# Patient Record
Sex: Female | Born: 1970
Health system: Southern US, Community
[De-identification: ages and names within clinical notes are randomized; demographics above are authoritative.]

## PROBLEM LIST (undated history)

## (undated) DIAGNOSIS — R002 Palpitations: Secondary | ICD-10-CM

## (undated) DIAGNOSIS — J45909 Unspecified asthma, uncomplicated: Secondary | ICD-10-CM

## (undated) DIAGNOSIS — I951 Orthostatic hypotension: Secondary | ICD-10-CM

## (undated) DIAGNOSIS — K589 Irritable bowel syndrome without diarrhea: Secondary | ICD-10-CM

## (undated) DIAGNOSIS — G43909 Migraine, unspecified, not intractable, without status migrainosus: Secondary | ICD-10-CM

## (undated) HISTORY — DX: Palpitations: R00.2

## (undated) HISTORY — DX: Migraine, unspecified, not intractable, without status migrainosus: G43.909

## (undated) HISTORY — DX: Orthostatic hypotension: I95.1

---

## 1999-02-14 ENCOUNTER — Ambulatory Visit (HOSPITAL_COMMUNITY): Admission: RE | Admit: 1999-02-14 | Discharge: 1999-02-14 | Payer: Self-pay | Admitting: Internal Medicine

## 2000-06-06 ENCOUNTER — Emergency Department (HOSPITAL_COMMUNITY): Admission: EM | Admit: 2000-06-06 | Discharge: 2000-06-06 | Payer: Self-pay

## 2002-02-13 ENCOUNTER — Emergency Department (HOSPITAL_COMMUNITY): Admission: EM | Admit: 2002-02-13 | Discharge: 2002-02-13 | Payer: Self-pay | Admitting: *Deleted

## 2004-08-21 ENCOUNTER — Other Ambulatory Visit: Admission: RE | Admit: 2004-08-21 | Discharge: 2004-08-21 | Payer: Self-pay | Admitting: Family Medicine

## 2005-08-19 ENCOUNTER — Other Ambulatory Visit: Admission: RE | Admit: 2005-08-19 | Discharge: 2005-08-19 | Payer: Self-pay | Admitting: Family Medicine

## 2006-10-07 ENCOUNTER — Ambulatory Visit (HOSPITAL_COMMUNITY): Admission: RE | Admit: 2006-10-07 | Discharge: 2006-10-07 | Payer: Self-pay | Admitting: Obstetrics and Gynecology

## 2006-11-09 ENCOUNTER — Inpatient Hospital Stay (HOSPITAL_COMMUNITY): Admission: AD | Admit: 2006-11-09 | Discharge: 2006-11-12 | Payer: Self-pay | Admitting: Obstetrics and Gynecology

## 2006-11-10 ENCOUNTER — Encounter (INDEPENDENT_AMBULATORY_CARE_PROVIDER_SITE_OTHER): Payer: Self-pay | Admitting: Obstetrics and Gynecology

## 2006-11-23 ENCOUNTER — Ambulatory Visit: Admission: RE | Admit: 2006-11-23 | Discharge: 2006-11-23 | Payer: Self-pay | Admitting: Obstetrics and Gynecology

## 2006-11-23 ENCOUNTER — Encounter: Admission: RE | Admit: 2006-11-23 | Discharge: 2006-12-21 | Payer: Self-pay | Admitting: Obstetrics and Gynecology

## 2006-12-01 ENCOUNTER — Ambulatory Visit: Admission: RE | Admit: 2006-12-01 | Discharge: 2006-12-01 | Payer: Self-pay | Admitting: Obstetrics and Gynecology

## 2007-04-07 ENCOUNTER — Other Ambulatory Visit: Admission: RE | Admit: 2007-04-07 | Discharge: 2007-04-07 | Payer: Self-pay | Admitting: Family Medicine

## 2008-04-06 ENCOUNTER — Other Ambulatory Visit: Admission: RE | Admit: 2008-04-06 | Discharge: 2008-04-06 | Payer: Self-pay | Admitting: Family Medicine

## 2009-06-04 ENCOUNTER — Other Ambulatory Visit: Admission: RE | Admit: 2009-06-04 | Discharge: 2009-06-04 | Payer: Self-pay | Admitting: Family Medicine

## 2010-07-09 ENCOUNTER — Other Ambulatory Visit: Payer: Self-pay | Admitting: Family Medicine

## 2010-07-09 ENCOUNTER — Other Ambulatory Visit (HOSPITAL_COMMUNITY)
Admission: RE | Admit: 2010-07-09 | Discharge: 2010-07-09 | Disposition: A | Discharge status: Home / Self Care | Payer: Managed Care, Other (non HMO) | Source: Ambulatory Visit | Attending: Family Medicine | Admitting: Family Medicine

## 2010-07-09 DIAGNOSIS — Z124 Encounter for screening for malignant neoplasm of cervix: Secondary | ICD-10-CM | POA: Insufficient documentation

## 2011-02-18 LAB — URINALYSIS, DIPSTICK ONLY
Bilirubin Urine: NEGATIVE
Glucose, UA: NEGATIVE
Ketones, ur: NEGATIVE
Leukocytes, UA: NEGATIVE
Nitrite: NEGATIVE
Protein, ur: NEGATIVE
Specific Gravity, Urine: <1.005 — ABNORMAL LOW
Urobilinogen, UA: 0.2
pH: 6.5

## 2011-02-18 LAB — LACTATE DEHYDROGENASE: LDH: 143

## 2011-02-18 LAB — CBC
HCT: 32.4 — ABNORMAL LOW
HCT: 38.3
Hemoglobin: 10.9 — ABNORMAL LOW
Hemoglobin: 12.8
MCHC: 33.4
MCHC: 33.6
MCV: 93.9
MCV: 95
Platelets: 179
Platelets: 202
RBC: 3.41 — ABNORMAL LOW
RBC: 4.07
RDW: 14
RDW: 14.2 — ABNORMAL HIGH
WBC: 14.2 — ABNORMAL HIGH
WBC: 9.3

## 2011-02-18 LAB — COMPREHENSIVE METABOLIC PANEL
ALT: 21
AST: 22
Albumin: 2.9 — ABNORMAL LOW
Alkaline Phosphatase: 288 — ABNORMAL HIGH
BUN: 7
CO2: 27
Calcium: 9.2
Chloride: 105
Creatinine, Ser: 0.64
GFR calc Af Amer: >60
GFR calc non Af Amer: >60
Glucose, Bld: 85
Potassium: 3.8
Sodium: 139
Total Bilirubin: 0.4
Total Protein: 6.6

## 2011-02-18 LAB — RPR TITER
RPR Titer: 1:1 {titer}
RPR Titer: 1:1 {titer}

## 2011-02-18 LAB — URIC ACID: Uric Acid, Serum: 5.5

## 2011-02-18 LAB — RPR
RPR Ser Ql: REACTIVE — AB
RPR Ser Ql: REACTIVE — AB

## 2011-02-28 LAB — TPPA
Treponema Confirm: NONREACTIVE
Treponema Confirm: NONREACTIVE

## 2012-02-27 ENCOUNTER — Other Ambulatory Visit: Payer: Self-pay | Admitting: Family Medicine

## 2012-02-27 DIAGNOSIS — Z1231 Encounter for screening mammogram for malignant neoplasm of breast: Secondary | ICD-10-CM

## 2012-03-19 ENCOUNTER — Ambulatory Visit: Payer: Managed Care, Other (non HMO)

## 2012-09-30 ENCOUNTER — Ambulatory Visit (INDEPENDENT_AMBULATORY_CARE_PROVIDER_SITE_OTHER): Payer: BC Managed Care – PPO | Admitting: Cardiovascular Disease

## 2012-09-30 ENCOUNTER — Encounter: Payer: Self-pay | Admitting: *Deleted

## 2012-09-30 VITALS — BP 140/87 | HR 71 | Ht 68.0 in | Wt 148.0 lb

## 2012-09-30 DIAGNOSIS — I951 Orthostatic hypotension: Secondary | ICD-10-CM

## 2012-09-30 NOTE — Patient Instructions (Addendum)
Your physician recommends that you schedule a follow-up appointment in: as needed basis   Your physician recommends that you continue on your current medications as directed. Please refer to the Current Medication list given to you today.   

## 2012-09-30 NOTE — Progress Notes (Signed)
Marian Sorrow Date of Birth  1971/02/11       [facility] Office [address], [address]  [address], [address] [place], [place]  [address]   [place], [place]  [address] [phone]     [phone]   Fax  [phone]    Fax [phone]  Problem List: 1. Orthostatic hypotension 2. Palpitations 3. Migraine headaches  History of Present Illness:  Yannis is a 42 yo who is referred today for evaluation of orthostasis.  Orthostatic readings today Supine- 133/83   HR 74 Sitting 138/76  HR 74 Standing 158/94 HR 74  Pt saw Dr. Graciela Husbands in the 1990s.  He started her on florinef and she has felt much better.  She has had more dizziness.  She fainted 2 weeks ago - associated with GI bug and diarrhea.  Other than that she has not had any syncopal episodes sine starting the Florinef in 1990s.   She does not get any regular exercise.  She is active as a Pharmacist, hospital at [employer])    Current Outpatient Prescriptions  Medication Sig Dispense Refill  . ALPRAZolam (XANAX) 0.25 MG tablet Take 0.25 mg by mouth at bedtime as needed for sleep.      . Cholecalciferol (VITAMIN D PO) Take 2,000 Units by mouth.      . diphenhydrAMINE (SOMINEX) 25 MG tablet Take 25 mg by mouth at bedtime as needed for sleep.      . Ferrous Sulfate 27 MG TABS Take 27 mg by mouth.      . fludrocortisone (FLORINEF) 0.1 MG tablet Take 0.1 mg by mouth daily.      Marland Kitchen gabapentin (NEURONTIN) 400 MG capsule Take 400 mg by mouth 3 (three) times daily.      Marland Kitchen ibuprofen (ADVIL,MOTRIN) 200 MG tablet Take 200 mg by mouth every 6 (six) hours as needed for pain.      Marland Kitchen lidocaine (LIDODERM) 5 % Place 1 patch onto the skin daily. Remove & Discard patch within 12 hours or as directed by MD      . Multiple Vitamins-Minerals (CENTRUM PO) Take by mouth daily.      . norethindrone-ethinyl estradiol-iron (MICROGESTIN FE,GILDESS FE,LOESTRIN FE) 1.5-30 MG-MCG tablet Take 1 tablet by  mouth daily.      . Nutritional Supplements (BCAD 1) POWD Take by mouth.      . Potassium (POTASSIMIN PO) Take 10 mEq by mouth.      . SUMAtriptan (IMITREX) 100 MG tablet 100 mg.      . traMADol (ULTRAM) 50 MG tablet Take 50 mg by mouth every 6 (six) hours as needed for pain.       No current facility-administered medications for this visit.     Allergies  Allergen Reactions  . Septra (Sulfamethoxazole-Tmp Ds)     Past Medical History  Diagnosis Date  . Palpitations   . Orthostatic hypotension   . Migraines     No past surgical history on file.  History  Smoking status  . Never Smoker   Smokeless tobacco  . Not on file    History  Alcohol Use: Not on file    No family history on file.  Reviw of Systems:  Reviewed in the HPI.  All other systems are negative.  Physical Exam: Blood pressure 140/87, pulse 71, height 5\' 8"  (1.727 m), weight 148 lb (67.132 kg). General: Well developed, well nourished, in no acute distress.  Head: Normocephalic, atraumatic, sclera non-icteric, mucus membranes are moist,   Neck: Supple. Carotids are 2 + without bruits. No JVD   Lungs: Clear   Heart: RR, normal S1, S2, no significant murmurs  Abdomen: Soft, non-tender, non-distended with normal bowel sounds.  + palpable abdominal aorta  Msk:  Strength and tone are normal   Extremities: No clubbing or cyanosis. No edema.  Distal pedal pulses are 2+ and equal    Neuro: CN II - XII intact.  Alert and oriented X 3.   Psych:  Normal   ECG: 09/07/12 ( Dr. Jone Baseman office)  NSR at 69. No ST or T wave changes   Assessment / Plan:

## 2012-09-30 NOTE — Assessment & Plan Note (Signed)
Diana Boyd presents today for followup of her orthostatic hypotension. She actually has done for well for the past 10 or 20 years. She's not had any episodes of syncope. She did have one episode of fainting when she had a GI bug and subsequent diarrhea. She still has some episodes of dizziness but this is in the setting of normal blood pressure and normal heart rate. At this point I don't think that there is anything further that I have to offer. His cardiac exam. I don't think that an echocardiogram would be helpful think she has relatively normal function. Her EKG is completely normal. There is no evidence of heart block.  She's on several medications that can cause her blood pressure to vary. She's on Xanax which can cause her blood pressure to decrease. She's on Imitrex which will cause her blood pressure to go up. She also is on Florinef which is keeping her volume status relatively high.   She brought her blood pressure readings with her today and Bunnie Domino are completely normal.  At this point don't think we should change in her medications. I've reassured her that her condition seems to be fairly stable. We'll see her back on an as-needed basis. If she has any further episodes of fainting have encouraged her to see Dr. Graciela Husbands for her dysautonomia.

## 2012-12-02 ENCOUNTER — Ambulatory Visit
Admission: RE | Admit: 2012-12-02 | Discharge: 2012-12-02 | Disposition: A | Discharge status: Home / Self Care | Payer: BC Managed Care – PPO | Source: Ambulatory Visit | Attending: Family Medicine | Admitting: Family Medicine

## 2012-12-02 DIAGNOSIS — Z1231 Encounter for screening mammogram for malignant neoplasm of breast: Secondary | ICD-10-CM

## 2012-12-03 ENCOUNTER — Other Ambulatory Visit: Payer: Self-pay | Admitting: Family Medicine

## 2012-12-03 DIAGNOSIS — R928 Other abnormal and inconclusive findings on diagnostic imaging of breast: Secondary | ICD-10-CM

## 2012-12-17 ENCOUNTER — Ambulatory Visit
Admission: RE | Admit: 2012-12-17 | Discharge: 2012-12-17 | Disposition: A | Discharge status: Home / Self Care | Payer: BC Managed Care – PPO | Source: Ambulatory Visit | Attending: Family Medicine | Admitting: Family Medicine

## 2012-12-17 DIAGNOSIS — R928 Other abnormal and inconclusive findings on diagnostic imaging of breast: Secondary | ICD-10-CM

## 2013-01-04 ENCOUNTER — Other Ambulatory Visit: Payer: Self-pay

## 2013-01-04 DIAGNOSIS — Z1231 Encounter for screening mammogram for malignant neoplasm of breast: Secondary | ICD-10-CM

## 2013-10-21 ENCOUNTER — Other Ambulatory Visit (HOSPITAL_COMMUNITY)
Admission: RE | Admit: 2013-10-21 | Discharge: 2013-10-21 | Disposition: A | Discharge status: Home / Self Care | Payer: BC Managed Care – PPO | Source: Ambulatory Visit | Attending: Family Medicine | Admitting: Family Medicine

## 2013-10-21 ENCOUNTER — Other Ambulatory Visit: Payer: Self-pay | Admitting: Family Medicine

## 2013-10-21 DIAGNOSIS — Z124 Encounter for screening for malignant neoplasm of cervix: Secondary | ICD-10-CM | POA: Insufficient documentation

## 2013-10-25 LAB — CYTOLOGY - PAP

## 2014-01-31 ENCOUNTER — Other Ambulatory Visit: Payer: Self-pay

## 2014-01-31 DIAGNOSIS — Z1231 Encounter for screening mammogram for malignant neoplasm of breast: Secondary | ICD-10-CM

## 2014-02-15 ENCOUNTER — Encounter (INDEPENDENT_AMBULATORY_CARE_PROVIDER_SITE_OTHER): Payer: Self-pay

## 2014-02-15 ENCOUNTER — Ambulatory Visit
Admission: RE | Admit: 2014-02-15 | Discharge: 2014-02-15 | Disposition: A | Discharge status: Home / Self Care | Payer: BC Managed Care – PPO | Source: Ambulatory Visit

## 2014-02-15 DIAGNOSIS — Z1231 Encounter for screening mammogram for malignant neoplasm of breast: Secondary | ICD-10-CM

## 2015-01-11 ENCOUNTER — Encounter: Payer: Self-pay | Admitting: Neurology

## 2015-01-11 ENCOUNTER — Ambulatory Visit (INDEPENDENT_AMBULATORY_CARE_PROVIDER_SITE_OTHER): Payer: BLUE CROSS/BLUE SHIELD | Admitting: Neurology

## 2015-01-11 VITALS — BP 145/88 | HR 73 | Resp 18 | Ht 69.0 in | Wt 156.0 lb

## 2015-01-11 DIAGNOSIS — G4440 Drug-induced headache, not elsewhere classified, not intractable: Secondary | ICD-10-CM

## 2015-01-11 DIAGNOSIS — I951 Orthostatic hypotension: Secondary | ICD-10-CM | POA: Diagnosis not present

## 2015-01-11 DIAGNOSIS — G43019 Migraine without aura, intractable, without status migrainosus: Secondary | ICD-10-CM | POA: Diagnosis not present

## 2015-01-11 DIAGNOSIS — T3995XA Adverse effect of unspecified nonopioid analgesic, antipyretic and antirheumatic, initial encounter: Secondary | ICD-10-CM

## 2015-01-11 MED ORDER — TOPIRAMATE 25 MG PO CPSP: 25.0000 mg | ORAL_CAPSULE | Freq: Every day | ORAL | Status: DC

## 2015-01-11 MED ORDER — SUMATRIPTAN SUCCINATE 25 MG PO TABS: ORAL_TABLET | ORAL | Status: DC

## 2015-01-11 NOTE — Patient Instructions (Signed)
Topiramate tablets What is this medicine? TOPIRAMATE (toe PYRE a mate) is used to treat seizures in adults or children with epilepsy. It is also used for the prevention of migraine headaches. This medicine may be used for other purposes; ask your health care provider or pharmacist if you have questions. COMMON BRAND NAME(S): Topamax, Topiragen What should I tell my health care provider before I take this medicine? They need to know if you have any of these conditions: -bleeding disorders -cirrhosis of the liver or liver disease -diarrhea -glaucoma -kidney stones or kidney disease -low blood counts, like low white cell, platelet, or red cell counts -lung disease like asthma, obstructive pulmonary disease, emphysema -metabolic acidosis -on a ketogenic diet -schedule for surgery or a procedure -suicidal thoughts, plans, or attempt; a previous suicide attempt by you or a family member -an unusual or allergic reaction to topiramate, other medicines, foods, dyes, or preservatives -pregnant or trying to get pregnant -breast-feeding How should I use this medicine? Take this medicine by mouth with a glass of water. Follow the directions on the prescription label. Do not crush or chew. You may take this medicine with meals. Take your medicine at regular intervals. Do not take it more often than directed. Talk to your pediatrician regarding the use of this medicine in children. Special care may be needed. While this drug may be prescribed for children as young as 2 years of age for selected conditions, precautions do apply. Overdosage: If you think you have taken too much of this medicine contact a poison control center or emergency room at once. NOTE: This medicine is only for you. Do not share this medicine with others. What if I miss a dose? If you miss a dose, take it as soon as you can. If your next dose is to be taken in less than 6 hours, then do not take the missed dose. Take the next dose at  your regular time. Do not take double or extra doses. What may interact with this medicine? Do not take this medicine with any of the following medications: -probenecid This medicine may also interact with the following medications: -acetazolamide -alcohol -amitriptyline -aspirin and aspirin-like medicines -birth control pills -certain medicines for depression -certain medicines for seizures -certain medicines that treat or prevent blood clots like warfarin, enoxaparin, dalteparin, apixaban, dabigatran, and rivaroxaban -digoxin -hydrochlorothiazide -lithium -medicines for pain, sleep, or muscle relaxation -metformin -methazolamide -NSAIDS, medicines for pain and inflammation, like ibuprofen or naproxen -pioglitazone -risperidone This list may not describe all possible interactions. Give your health care provider a list of all the medicines, herbs, non-prescription drugs, or dietary supplements you use. Also tell them if you smoke, drink alcohol, or use illegal drugs. Some items may interact with your medicine. What should I watch for while using this medicine? Visit your doctor or health care professional for regular checks on your progress. Do not stop taking this medicine suddenly. This increases the risk of seizures if you are using this medicine to control epilepsy. Wear a medical identification bracelet or chain to say you have epilepsy or seizures, and carry a card that lists all your medicines. This medicine can decrease sweating and increase your body temperature. Watch for signs of deceased sweating or fever, especially in children. Avoid extreme heat, hot baths, and saunas. Be careful about exercising, especially in hot weather. Contact your health care provider right away if you notice a fever or decrease in sweating. You should drink plenty of fluids while taking this medicine.   If you have had kidney stones in the past, this will help to reduce your chances of forming kidney  stones. If you have stomach pain, with nausea or vomiting and yellowing of your eyes or skin, call your doctor immediately. You may get drowsy, dizzy, or have blurred vision. Do not drive, use machinery, or do anything that needs mental alertness until you know how this medicine affects you. To reduce dizziness, do not sit or stand up quickly, especially if you are an older patient. Alcohol can increase drowsiness and dizziness. Avoid alcoholic drinks. If you notice blurred vision, eye pain, or other eye problems, seek medical attention at once for an eye exam. The use of this medicine may increase the chance of suicidal thoughts or actions. Pay special attention to how you are responding while on this medicine. Any worsening of mood, or thoughts of suicide or dying should be reported to your health care professional right away. This medicine may increase the chance of developing metabolic acidosis. If left untreated, this can cause kidney stones, bone disease, or slowed growth in children. Symptoms include breathing fast, fatigue, loss of appetite, irregular heartbeat, or loss of consciousness. Call your doctor immediately if you experience any of these side effects. Also, tell your doctor about any surgery you plan on having while taking this medicine since this may increase your risk for metabolic acidosis. Birth control pills may not work properly while you are taking this medicine. Talk to your doctor about using an extra method of birth control. Women who become pregnant while using this medicine may enroll in the [place] American Antiepileptic Drug Pregnancy Registry by calling 1-888-233-2334. This registry collects information about the safety of antiepileptic drug use during pregnancy. What side effects may I notice from receiving this medicine? Side effects that you should report to your doctor or health care professional as soon as possible: -allergic reactions like skin rash, itching or hives,  swelling of the face, lips, or tongue -decreased sweating and/or rise in body temperature -depression -difficulty breathing, fast or irregular breathing patterns -difficulty speaking -difficulty walking or controlling muscle movements -hearing impairment -redness, blistering, peeling or loosening of the skin, including inside the mouth -tingling, pain or numbness in the hands or feet -unusual bleeding or bruising -unusually weak or tired -worsening of mood, thoughts or actions of suicide or dying Side effects that usually do not require medical attention (report to your doctor or health care professional if they continue or are bothersome): -altered taste -back pain, joint or muscle aches and pains -diarrhea, or constipation -headache -loss of appetite -nausea -stomach upset, indigestion -tremors This list may not describe all possible side effects. Call your doctor for medical advice about side effects. You may report side effects to FDA at 1-800-FDA-1088. Where should I keep my medicine? Keep out of the reach of children. Store at room temperature between 15 and 30 degrees C (59 and 86 degrees F) in a tightly closed container. Protect from moisture. Throw away any unused medicine after the expiration date. NOTE: This sheet is a summary. It may not cover all possible information. If you have questions about this medicine, talk to your doctor, pharmacist, or health care provider.  2015, Elsevier/Gold Standard. (2013-04-25 23:17:57)  
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
## 2015-02-02 ENCOUNTER — Telehealth: Payer: Self-pay | Admitting: Neurology

## 2015-02-02 MED ORDER — BUTALBITAL-APAP-CAFFEINE 50-325-40 MG PO TABS: 1.0000 | ORAL_TABLET | Freq: Four times a day (QID) | ORAL | Status: DC | PRN

## 2015-02-02 NOTE — Telephone Encounter (Signed)
The patient is exquisitely sensitive to sumatriptan 25 mg which has been the lowest dose I could find for triptan anywhere. I will ask her to break the tablet in half but apparently the size of the tablet does not allow a clear separation. I would instead ask her to try a low dose of Frova. I also will be happy to give her some Fioricet for that she can sustain a workday. CD

## 2015-02-02 NOTE — Telephone Encounter (Signed)
Patient is calling and states that the Rx Sumatriptan 25 mg she is taking is making her pass out and throw up.  Is there anything else she can take during the day for her migraines. Please call.

## 2015-02-22 ENCOUNTER — Ambulatory Visit: Payer: BLUE CROSS/BLUE SHIELD | Admitting: Adult Health

## 2015-03-22 ENCOUNTER — Ambulatory Visit (INDEPENDENT_AMBULATORY_CARE_PROVIDER_SITE_OTHER): Payer: BLUE CROSS/BLUE SHIELD | Admitting: Adult Health

## 2015-03-22 ENCOUNTER — Encounter: Payer: Self-pay | Admitting: Adult Health

## 2015-03-22 VITALS — BP 150/82 | HR 71 | Ht 69.0 in | Wt 154.0 lb

## 2015-03-22 DIAGNOSIS — G43009 Migraine without aura, not intractable, without status migrainosus: Secondary | ICD-10-CM

## 2015-03-22 MED ORDER — TOPIRAMATE 25 MG PO TABS: 50.0000 mg | ORAL_TABLET | Freq: Every day | ORAL | Status: DC

## 2015-03-22 NOTE — Patient Instructions (Signed)
Try increasing Topamax to 2 tablets at bedtime (total 50 mg) Use back up method for birth control while on Topamax If your symptoms worsen or you develop new symptoms please let us know.
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
## 2015-03-22 NOTE — Progress Notes (Signed)
I agree with the assessment and plan as directed by NP .The patient is known to me .   Caleel Kiner, MD  

## 2015-06-07 ENCOUNTER — Other Ambulatory Visit: Payer: Self-pay

## 2015-06-07 DIAGNOSIS — Z1231 Encounter for screening mammogram for malignant neoplasm of breast: Secondary | ICD-10-CM

## 2015-06-21 ENCOUNTER — Ambulatory Visit: Payer: Self-pay

## 2015-06-28 ENCOUNTER — Ambulatory Visit: Payer: BLUE CROSS/BLUE SHIELD | Admitting: Adult Health

## 2015-06-28 DIAGNOSIS — Z0279 Encounter for issue of other medical certificate: Secondary | ICD-10-CM

## 2015-06-29 ENCOUNTER — Encounter: Payer: Self-pay | Admitting: Adult Health

## 2015-07-05 ENCOUNTER — Ambulatory Visit: Payer: Self-pay

## 2015-08-02 ENCOUNTER — Ambulatory Visit
Admission: RE | Admit: 2015-08-02 | Discharge: 2015-08-02 | Disposition: A | Discharge status: Home / Self Care | Payer: 59 | Source: Ambulatory Visit

## 2015-08-02 DIAGNOSIS — Z1231 Encounter for screening mammogram for malignant neoplasm of breast: Secondary | ICD-10-CM

## 2015-09-25 ENCOUNTER — Ambulatory Visit: Payer: BLUE CROSS/BLUE SHIELD | Admitting: Adult Health

## 2015-10-02 ENCOUNTER — Ambulatory Visit (INDEPENDENT_AMBULATORY_CARE_PROVIDER_SITE_OTHER): Payer: 59 | Admitting: Adult Health

## 2015-10-02 ENCOUNTER — Encounter: Payer: Self-pay | Admitting: Adult Health

## 2015-10-02 VITALS — BP 138/82 | HR 72 | Resp 18 | Ht 69.0 in | Wt 157.0 lb

## 2015-10-02 DIAGNOSIS — G43009 Migraine without aura, not intractable, without status migrainosus: Secondary | ICD-10-CM

## 2015-10-02 MED ORDER — ZONISAMIDE 25 MG PO CAPS: 75.0000 mg | ORAL_CAPSULE | Freq: Every day | ORAL | Status: DC

## 2015-10-02 NOTE — Progress Notes (Signed)
I agree with the assessment and plan as directed by NP .The patient is known to me .   Kanoe Wanner, MD  
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
## 2015-10-02 NOTE — Patient Instructions (Signed)
Increase Zonegran 75 mg daily  If your symptoms worsen or you develop new symptoms please let us know.

## 2015-10-22 ENCOUNTER — Telehealth: Payer: Self-pay | Admitting: Adult Health

## 2015-10-22 NOTE — Telephone Encounter (Signed)
Byron came in and she said that when she came in here left she is taking Zonegran from 50 MG to 75 MG. And she is having problems with the increase in MG.. She is having memory problems. Short term and long term memory problems. Also having problems focusing. She is willing to try a new drug for her memory. The best number to contact the patient is [phone]

## 2015-10-23 MED ORDER — ZONISAMIDE 25 MG PO CAPS
50.0000 mg | ORAL_CAPSULE | Freq: Every day | ORAL | Status: DC
Start: 2015-10-23 — End: 2015-10-31

## 2015-10-23 NOTE — Telephone Encounter (Signed)
I called the patient. She states that when Zonegran was increased to 75 mg she started to notice problems with her memory. She states that it is affecting her at work. However it has been very beneficial for her headaches. She states that she has 1 headache a week at this dose. For now we will decrease Zonegran to 50 mg. If her symptoms do not improve we will have to discontinue this medication and try another one.

## 2015-10-23 NOTE — Telephone Encounter (Signed)
Pt has f/u here 01-10-16.   Last seen 10-02-15 when zonegran increased.

## 2015-10-25 ENCOUNTER — Other Ambulatory Visit: Payer: Self-pay | Admitting: Neurology

## 2015-10-30 NOTE — Telephone Encounter (Signed)
I spoke to pt.  She stated with the 50mg  zonegran still with problems, focus, memory.  Baclofen not working, makes groggy as well.  Since asking for medication changes, made appt for tomorrow at 1500.  Pt verbalized understanding. She also mentioned sleep issues, can get to sleep but awakens and cannot go back to sleep.

## 2015-10-30 NOTE — Telephone Encounter (Signed)
Patient called to advise, "Zonegran and Baclofen are not working, medicines need to be changed. Melatonin not helping, needs something for sleep, gets to sleep okay, doesn't stay asleep".

## 2015-10-31 ENCOUNTER — Encounter: Payer: Self-pay | Admitting: Adult Health

## 2015-10-31 ENCOUNTER — Ambulatory Visit (INDEPENDENT_AMBULATORY_CARE_PROVIDER_SITE_OTHER): Payer: 59 | Admitting: Adult Health

## 2015-10-31 VITALS — BP 156/86 | HR 68 | Ht 69.0 in | Wt 157.6 lb

## 2015-10-31 DIAGNOSIS — G43019 Migraine without aura, intractable, without status migrainosus: Secondary | ICD-10-CM

## 2015-10-31 MED ORDER — SUMATRIPTAN SUCCINATE 25 MG PO TABS: ORAL_TABLET | ORAL | Status: DC

## 2015-10-31 MED ORDER — NORTRIPTYLINE HCL 10 MG PO CAPS: 10.0000 mg | ORAL_CAPSULE | Freq: Every day | ORAL | Status: DC

## 2015-10-31 NOTE — Progress Notes (Signed)
I have read the note, and I agree with the clinical assessment and plan.  Romina Divirgilio KEITH   

## 2015-10-31 NOTE — Patient Instructions (Signed)
Take zonegran 25 mg for next two nights then discontinue Start Nortriptyline 10 mg at bedtime Stop baclofen Use Imitrex 1 tablet at onset of migraine can repeat in 2 hours if needed If your symptoms worsen or you develop new symptoms please let us know.  Nortriptyline capsules What is this medicine? NORTRIPTYLINE (nor TRIP ti leen) is used to treat depression. This medicine may be used for other purposes; ask your health care provider or pharmacist if you have questions. What should I tell my health care provider before I take this medicine? They need to know if you have any of these conditions: -an alcohol problem -bipolar disorder or schizophrenia -difficulty passing urine, prostate trouble -glaucoma -heart disease or recent heart attack -liver disease -over active thyroid -seizures -thoughts or plans of suicide or a previous suicide attempt or family history of suicide attempt -an unusual or allergic reaction to nortriptyline, other medicines, foods, dyes, or preservatives -pregnant or trying to get pregnant -breast-feeding How should I use this medicine? Take this medicine by mouth with a glass of water. Follow the directions on the prescription label. Take your doses at regular intervals. Do not take it more often than directed. Do not stop taking this medicine suddenly except upon the advice of your doctor. Stopping this medicine too quickly may cause serious side effects or your condition may worsen. A special MedGuide will be given to you by the pharmacist with each prescription and refill. Be sure to read this information carefully each time. Talk to your pediatrician regarding the use of this medicine in children. Special care may be needed. Overdosage: If you think you have taken too much of this medicine contact a poison control center or emergency room at once. NOTE: This medicine is only for you. Do not share this medicine with others. What if I miss a dose? If you miss a  dose, take it as soon as you can. If it is almost time for your next dose, take only that dose. Do not take double or extra doses. What may interact with this medicine? Do not take this medicine with any of the following medications: -arsenic trioxide -certain medicines medicines for irregular heart beat -cisapride -halofantrine -linezolid -MAOIs like Carbex, Eldepryl, Marplan, Nardil, and Parnate -methylene blue (injected into a vein) -other medicines for mental depression -phenothiazines like perphenazine, thioridazine and chlorpromazine -pimozide -probucol -procarbazine -sparfloxacin -St. John's Wort -ziprasidone This medicine may also interact with any of the following medications: -atropine and related drugs like hyoscyamine, scopolamine, tolterodine and others -barbiturate medicines for inducing sleep or treating seizures, such as phenobarbital -cimetidine -medicines for diabetes -medicines for seizures like carbamazepine or phenytoin -reserpine -thyroid medicine This list may not describe all possible interactions. Give your health care provider a list of all the medicines, herbs, non-prescription drugs, or dietary supplements you use. Also tell them if you smoke, drink alcohol, or use illegal drugs. Some items may interact with your medicine. What should I watch for while using this medicine? Tell your doctor if your symptoms do not get better or if they get worse. Visit your doctor or health care professional for regular checks on your progress. Because it may take several weeks to see the full effects of this medicine, it is important to continue your treatment as prescribed by your doctor. Patients and their families should watch out for new or worsening thoughts of suicide or depression. Also watch out for sudden changes in feelings such as feeling anxious, agitated, panicky, irritable, hostile, aggressive,  impulsive, severely restless, overly excited and hyperactive, or not  being able to sleep. If this happens, especially at the beginning of treatment or after a change in dose, call your health care professional. Bonita QuinYou may get drowsy or dizzy. Do not drive, use machinery, or do anything that needs mental alertness until you know how this medicine affects you. Do not stand or sit up quickly, especially if you are an older patient. This reduces the risk of dizzy or fainting spells. Alcohol may interfere with the effect of this medicine. Avoid alcoholic drinks. Do not treat yourself for coughs, colds, or allergies without asking your doctor or health care professional for advice. Some ingredients can increase possible side effects. Your mouth may get dry. Chewing sugarless gum or sucking hard candy, and drinking plenty of water may help. Contact your doctor if the problem does not go away or is severe. This medicine may cause dry eyes and blurred vision. If you wear contact lenses you may feel some discomfort. Lubricating drops may help. See your eye doctor if the problem does not go away or is severe. This medicine can cause constipation. Try to have a bowel movement at least every 2 to 3 days. If you do not have a bowel movement for 3 days, call your doctor or health care professional. This medicine can make you more sensitive to the sun. Keep out of the sun. If you cannot avoid being in the sun, wear protective clothing and use sunscreen. Do not use sun lamps or tanning beds/booths. What side effects may I notice from receiving this medicine? Side effects that you should report to your doctor or health care professional as soon as possible: -allergic reactions like skin rash, itching or hives, swelling of the face, lips, or tongue -abnormal production of milk in females -breast enlargement in both males and females -breathing problems -confusion, hallucinations -fever with increased sweating -irregular or fast, pounding heartbeat -muscle stiffness, or spasms -pain or  difficulty passing urine, loss of bladder control -seizures -suicidal thoughts or other mood changes -swelling of the testicles -tingling, pain, or numbness in the feet or hands -yellowing of the eyes or skin Side effects that usually do not require medical attention (report to your doctor or health care professional if they continue or are bothersome): -change in sex drive or performance -diarrhea -nausea, vomiting -weight gain or loss This list may not describe all possible side effects. Call your doctor for medical advice about side effects. You may report side effects to FDA at 1-800-FDA-1088. Where should I keep my medicine? Keep out of the reach of children. Store at room temperature between 15 and 30 degrees C (59 and 86 degrees F). Keep container tightly closed. Throw away any unused medicine after the expiration date. NOTE: This sheet is a summary. It may not cover all possible information. If you have questions about this medicine, talk to your doctor, pharmacist, or health care provider.    2016, Elsevier/Gold Standard. (2011-09-08 13:57:12)
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
## 2015-11-01 ENCOUNTER — Telehealth: Payer: Self-pay

## 2015-11-01 NOTE — Telephone Encounter (Signed)
[phone] (received fax confirmation).

## 2015-11-01 NOTE — Telephone Encounter (Signed)
Received fax from [facility].    Medication is on plan's list of covered drugs.  PA not required at this time.  [phone].   [id number]

## 2015-11-01 NOTE — Telephone Encounter (Signed)
Completed pa for sumatriptan, [facility]. Should have a determination in 3-5 business days.

## 2015-11-12 ENCOUNTER — Telehealth: Payer: Self-pay | Admitting: Adult Health

## 2015-11-12 NOTE — Telephone Encounter (Signed)
Pt called to advise she is still not sleeping. sts 10mg  melatonin is not working. She took a benadryl last night. Please call

## 2015-11-13 ENCOUNTER — Encounter: Payer: Self-pay | Admitting: Adult Health

## 2015-11-13 NOTE — Telephone Encounter (Signed)
I called the patient and left message.

## 2015-11-13 NOTE — Telephone Encounter (Signed)
Pt returned Megan's call. °

## 2015-11-14 MED ORDER — NORTRIPTYLINE HCL 10 MG PO CAPS: 20.0000 mg | ORAL_CAPSULE | Freq: Every day | ORAL | Status: DC

## 2015-11-14 NOTE — Telephone Encounter (Signed)
Responded to patients email.

## 2015-11-28 ENCOUNTER — Other Ambulatory Visit: Payer: Self-pay | Admitting: Neurology

## 2015-11-28 ENCOUNTER — Encounter: Payer: Self-pay | Admitting: Adult Health

## 2015-11-28 MED ORDER — NORTRIPTYLINE HCL 25 MG PO CAPS: 25.0000 mg | ORAL_CAPSULE | Freq: Every day | ORAL | 12 refills | Status: DC

## 2015-12-07 ENCOUNTER — Encounter: Payer: Self-pay | Admitting: Adult Health

## 2016-01-02 ENCOUNTER — Encounter: Payer: Self-pay | Admitting: Adult Health

## 2016-01-02 MED ORDER — PREDNISONE 5 MG PO TABS: ORAL_TABLET | ORAL | 0 refills | Status: DC

## 2016-01-02 NOTE — Telephone Encounter (Signed)
I called the patient. She states prior to Friday she was having 1-2 headaches a week. Which is a great improvement in her migraines. She states since Friday she's had a headache that she is been unable to resolve. She's had prednisone several years back. I will order the patient a prednisone Dosepak I have reviewed the side effects with the patient.

## 2016-01-10 ENCOUNTER — Ambulatory Visit: Payer: 59 | Admitting: Adult Health

## 2016-01-24 ENCOUNTER — Ambulatory Visit: Payer: 59 | Admitting: Adult Health

## 2016-02-11 ENCOUNTER — Other Ambulatory Visit: Payer: Self-pay | Admitting: Neurology

## 2016-02-28 ENCOUNTER — Ambulatory Visit (INDEPENDENT_AMBULATORY_CARE_PROVIDER_SITE_OTHER): Payer: 59 | Admitting: Adult Health

## 2016-02-28 ENCOUNTER — Encounter: Payer: Self-pay | Admitting: Adult Health

## 2016-02-28 VITALS — BP 142/90 | HR 80 | Resp 16 | Ht 69.0 in | Wt 157.5 lb

## 2016-02-28 DIAGNOSIS — G4700 Insomnia, unspecified: Secondary | ICD-10-CM | POA: Diagnosis not present

## 2016-02-28 DIAGNOSIS — G43009 Migraine without aura, not intractable, without status migrainosus: Secondary | ICD-10-CM

## 2016-02-28 MED ORDER — NORTRIPTYLINE HCL 10 MG PO CAPS: 30.0000 mg | ORAL_CAPSULE | Freq: Every day | ORAL | 11 refills | Status: DC

## 2016-02-28 MED ORDER — SUMATRIPTAN SUCCINATE 3 MG/0.5ML ~~LOC~~ SOAJ: 3.0000 mg | Freq: Every day | SUBCUTANEOUS | 5 refills | Status: DC | PRN

## 2016-02-28 NOTE — Patient Instructions (Addendum)
Increase nortriptyline to 30 mg at bedtime Try zembrace- sumatriptan injection for headache 3mg /0.5 ml- 1 injection as soon as the headache starts. If your symptoms worsen or you develop new symptoms please let us know.
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
## 2016-02-28 NOTE — Progress Notes (Signed)
I agree with the assessment and plan as directed by NP .The patient is known to me .   Brandi Armato, MD  

## 2016-03-19 ENCOUNTER — Ambulatory Visit: Payer: 59 | Admitting: Neurology

## 2016-04-13 ENCOUNTER — Encounter: Payer: Self-pay | Admitting: Adult Health

## 2016-04-14 MED ORDER — PREDNISONE 5 MG PO TABS: ORAL_TABLET | ORAL | 0 refills | Status: DC

## 2016-04-14 NOTE — Telephone Encounter (Signed)
Patient will be given a prednisone Dosepak for ongoing headache. Her last Dosepak was in August. If this does not resolve her headache and we will need to consider adjusting her medications.

## 2016-04-23 ENCOUNTER — Encounter: Payer: Self-pay | Admitting: Adult Health

## 2016-04-23 ENCOUNTER — Other Ambulatory Visit: Payer: Self-pay | Admitting: Adult Health

## 2016-04-24 ENCOUNTER — Telehealth: Payer: Self-pay | Admitting: Adult Health

## 2016-04-24 NOTE — Telephone Encounter (Signed)
I called the patient. Left a voice message. Perhaps we could increase nortriptyline to 40 mg if she feels that she can tolerate it.

## 2016-04-24 NOTE — Telephone Encounter (Signed)
Pt called back reg email. I told her it was rec'd but it has not been addressed yet by NP. Says she wanted to make sure it went to NP

## 2016-05-19 ENCOUNTER — Telehealth: Payer: Self-pay | Admitting: *Deleted

## 2016-05-19 NOTE — Telephone Encounter (Signed)
PA approval Zembrace. Approved thru 05-09-2017.  ID# [id number].  PA # [id number]. [facility] [phone]. ssy

## 2016-05-30 ENCOUNTER — Encounter: Payer: Self-pay | Admitting: Adult Health

## 2016-06-02 ENCOUNTER — Other Ambulatory Visit: Payer: Self-pay | Admitting: *Deleted

## 2016-06-02 MED ORDER — NORTRIPTYLINE HCL 10 MG PO CAPS: 40.0000 mg | ORAL_CAPSULE | Freq: Every day | ORAL | 9 refills | Status: DC

## 2016-07-03 ENCOUNTER — Encounter: Payer: Self-pay | Admitting: Adult Health

## 2016-09-12 DIAGNOSIS — I951 Orthostatic hypotension: Secondary | ICD-10-CM | POA: Diagnosis not present

## 2016-09-12 DIAGNOSIS — G43909 Migraine, unspecified, not intractable, without status migrainosus: Secondary | ICD-10-CM | POA: Diagnosis not present

## 2016-09-12 DIAGNOSIS — R030 Elevated blood-pressure reading, without diagnosis of hypertension: Secondary | ICD-10-CM | POA: Diagnosis not present

## 2016-09-12 DIAGNOSIS — D649 Anemia, unspecified: Secondary | ICD-10-CM | POA: Diagnosis not present

## 2016-09-16 ENCOUNTER — Encounter: Payer: Self-pay | Admitting: Adult Health

## 2016-09-18 ENCOUNTER — Other Ambulatory Visit: Payer: Self-pay | Admitting: Adult Health

## 2016-09-19 NOTE — Telephone Encounter (Signed)
This looks like you discontinued back in 02/2016, Last email she was taking.  On zembrace too.  Refill?

## 2016-09-24 ENCOUNTER — Other Ambulatory Visit: Payer: Self-pay | Admitting: Family Medicine

## 2016-09-24 DIAGNOSIS — Z1231 Encounter for screening mammogram for malignant neoplasm of breast: Secondary | ICD-10-CM

## 2016-09-25 ENCOUNTER — Encounter: Payer: Self-pay | Admitting: Adult Health

## 2016-10-15 ENCOUNTER — Ambulatory Visit: Payer: 59

## 2016-10-22 ENCOUNTER — Other Ambulatory Visit: Payer: Self-pay | Admitting: Adult Health

## 2016-10-30 ENCOUNTER — Encounter: Payer: Self-pay | Admitting: Adult Health

## 2016-10-30 MED ORDER — PREDNISONE 5 MG PO TABS: ORAL_TABLET | ORAL | 0 refills | Status: DC

## 2016-11-03 ENCOUNTER — Ambulatory Visit
Admission: RE | Admit: 2016-11-03 | Discharge: 2016-11-03 | Disposition: A | Discharge status: Home / Self Care | Payer: 59 | Source: Ambulatory Visit | Attending: Family Medicine | Admitting: Family Medicine

## 2016-11-03 DIAGNOSIS — Z1231 Encounter for screening mammogram for malignant neoplasm of breast: Secondary | ICD-10-CM

## 2016-11-20 ENCOUNTER — Encounter: Payer: Self-pay | Admitting: Adult Health

## 2016-11-20 ENCOUNTER — Ambulatory Visit (INDEPENDENT_AMBULATORY_CARE_PROVIDER_SITE_OTHER): Payer: 59 | Admitting: Adult Health

## 2016-11-20 VITALS — BP 142/90 | HR 72 | Ht 69.0 in | Wt 172.2 lb

## 2016-11-20 DIAGNOSIS — G43019 Migraine without aura, intractable, without status migrainosus: Secondary | ICD-10-CM | POA: Diagnosis not present

## 2016-11-20 MED ORDER — SUMATRIPTAN SUCCINATE 25 MG PO TABS: ORAL_TABLET | ORAL | 5 refills | Status: DC

## 2016-11-20 NOTE — Patient Instructions (Addendum)
Your Plan:  Continue Imitrex Aimovig ordered If your symptoms worsen or you develop new symptoms please let us know.   Thank you for coming to see us at [facility]. I hope we have been able to provide you high quality care today.  You may receive a patient satisfaction survey over the next few weeks. We would appreciate your feedback and comments so that we may continue to improve ourselves and the health of our patients.

## 2016-11-20 NOTE — Progress Notes (Signed)
PATIENT: Diana Boyd DOB: 27-Mar-1971  REASON FOR VISIT: follow up- migraine headaches HISTORY FROM: patient  HISTORY OF PRESENT ILLNESS: Diana Boyd is a 46 year old female with a history of migraine headaches. She returns today for follow-up. The patient states that she stopped nortriptyline about a month ago. Reports that it was not beneficial and was causing weight gain. She states that she essentially has a daily headache. It is normally across the forehead. She does have photophobia but denies phonophobia. She reports that she occasionally has nausea and vomiting as well. She has tried multiple medications in the past including gabapentin, Topamax, beta blockers, Zonegran, baclofen. She states Imitrex does offer her some benefit. She returns today for an evaluation.  HISTORY Diana Boyd is a 46 year old female with a history of migraine headaches. She returns today for follow-up. She is currently taking nortriptyline 25 mg at bedtime. She reports that this has been beneficial for her headaches. She has approximately 2 headaches a week. She feels that her headaches are related to her hormones. She states that she does use Imitrex with good benefit however it does have a very slow onset and sometimes it does not resolve her headaches completely. The patient has been on several other medications in the past with minimal benefit. It appears that nortriptyline has offered her the best benefit this far. She also struggles with insomnia. She has never had a sleep study. She does not feel that she snores. She is sometimes tired during the day. She returns today for an evaluation.  HISTORY 10/31/15: Diana Boyd is a 46 year old female with a history of migraine headaches. She returns today for follow-up. She states that the Zonegran 75 mg did help with her headaches but it was affecting her memory and attention. We decreased her to 50 mg however her headaches returned. She states that she also has been using  baclofen as directed by Dr. Neale BurlyFreeman for acute therapy however this has also not beneficial. She states that she had 2 severe headaches last week and had Imitrex left over from a previous prescriptions. She state after taking Imitrex her headache resolved within an hour. She states that she typically has daily headaches. These headaches are not always severe. Headaches are typically located across the forehead and in the temporal region. On occasion she does have photophobia and phonophobia. But denies nausea and vomiting. In the past she has been on gabapentin, Topamax, beta blockers, Zonegran, baclofen with minimal to no benefit. She returns today for an evaluation.  REVIEW OF SYSTEMS: Out of a complete 14 system review of symptoms, the patient complains only of the following symptoms, and all other reviewed systems are negative.  Abdominal pain, constipation, light sensitivity, headache, memory loss  ALLERGIES: Allergies  Allergen Reactions  . Septra [Sulfamethoxazole-Trimethoprim]   . Sulfamethoxazole-Trimethoprim Rash    HOME MEDICATIONS: Outpatient Medications Prior to Visit  Medication Sig Dispense Refill  . ALPRAZolam (XANAX) 0.25 MG tablet Take 0.25 mg by mouth at bedtime as needed for sleep.    . Cholecalciferol (VITAMIN D PO) Take 10,000 Units by mouth.     . Ferrous Sulfate 27 MG TABS Take 27 mg by mouth.    . fludrocortisone (FLORINEF) 0.1 MG tablet Take 0.1 mg by mouth daily.    Marland Kitchen. lidocaine (LIDODERM) 5 % Place 1 patch onto the skin daily. Remove & Discard patch within 12 hours or as directed by MD    . Melatonin 10 MG CAPS Take by mouth.    .Marland Kitchen  Multiple Vitamins-Minerals (CENTRUM PO) Take by mouth daily.    . norethindrone-ethinyl estradiol-iron (MICROGESTIN FE,GILDESS FE,LOESTRIN FE) 1.5-30 MG-MCG tablet Take 1 tablet by mouth daily.    . Potassium (POTASSIMIN PO) Take 10 mEq by mouth 2 (two) times daily.     . SUMAtriptan (IMITREX) 25 MG tablet TAKE 1 TABLET BY MOUTH AT ONSET  OF HEADACHE. MAY REPEAT IN 2 HOURS IF HEADACHE PERSISTS OR RECURS 10 tablet 0  . SUMAtriptan Succinate (ZEMBRACE SYMTOUCH) 3 MG/0.5ML SOAJ Inject 3 mg into the skin daily as needed. 0.5 mL 5  . nortriptyline (PAMELOR) 10 MG capsule Take 4 capsules (40 mg total) by mouth at bedtime. (Patient not taking: Reported on 11/20/2016) 120 capsule 9  . predniSONE (DELTASONE) 5 MG tablet Begin taking 6 tablets daily, taper by one tablet daily until off the medication. (Patient not taking: Reported on 11/20/2016) 21 tablet 0   No facility-administered medications prior to visit.     PAST MEDICAL HISTORY: Past Medical History:  Diagnosis Date  . Migraines   . Orthostatic hypotension   . Palpitations     PAST SURGICAL HISTORY: No past surgical history on file.  FAMILY HISTORY: Family History  Problem Relation Age of Onset  . Prostate cancer Father   . COPD Father     SOCIAL HISTORY: Social History   Social History  . Marital status: Married    Spouse name: N/A  . Number of children: N/A  . Years of education: N/A   Occupational History  . Not on file.   Social History Main Topics  . Smoking status: Never Smoker  . Smokeless tobacco: Never Used  . Alcohol use Not on file  . Drug use: Unknown  . Sexual activity: Not on file   Other Topics Concern  . Not on file   Social History Narrative  . No narrative on file      PHYSICAL EXAM  Vitals:   11/20/16 1451  BP: (!) 142/90  Pulse: 72  Weight: 172 lb 3.2 oz (78.1 kg)  Height: 5\' 9"  (1.753 m)   Body mass index is 25.43 kg/m.  Generalized: Well developed, in no acute distress   Neurological examination  Mentation: Alert oriented to time, place, history taking. Follows all commands speech and language fluent Cranial nerve II-XII: Pupils were equal round reactive to light. Extraocular movements were full, visual field were full on confrontational test. Facial sensation and strength were normal. Uvula tongue midline. Head  turning and shoulder shrug  were normal and symmetric. Motor: The motor testing reveals 5 over 5 strength of all 4 extremities. Good symmetric motor tone is noted throughout.  Sensory: Sensory testing is intact to soft touch on all 4 extremities. No evidence of extinction is noted.  Coordination: Cerebellar testing reveals good finger-nose-finger and heel-to-shin bilaterally.  Gait and station: Gait is normal. Tandem gait is normal. Romberg is negative. No drift is seen.  Reflexes: Deep tendon reflexes are symmetric and normal bilaterally.   DIAGNOSTIC DATA (LABS, IMAGING, TESTING) - I reviewed patient records, labs, notes, testing and imaging myself where available.   ASSESSMENT AND PLAN 46 y.o. year old female  has a past medical history of Migraines; Orthostatic hypotension; and Palpitations. here with:  1. Migraine headaches  The patient has tried multiple medications and continues to have a daily headache. We discussed Botox and Aimovig. The patient would like to try Aimovig. I reviewed side effects with the patient. I will refill Imitrex today. RN reviewed how to  complete the injection with the patient. Patient is advised that if this is not beneficial for her headaches we can consider Botox. Patient voices understanding. She will follow-up in 6 months with Dr. Anne Hahn.  I spent 15 minutes with the patient. 50% of this time was spent discussing the medication Aimovig   Diana Penny, MSN, NP-C 11/20/2016, 3:07 PM [facility] [address] [place], [place] [address] [phone]

## 2016-11-21 NOTE — Progress Notes (Signed)
I agree with the assessment and plan as directed by NP .The patient is known to me .   Dawood Spitler, MD  

## 2016-11-21 NOTE — Progress Notes (Signed)
Received fax confirmation for aimovig [phone]. sy

## 2016-11-24 ENCOUNTER — Encounter: Payer: Self-pay | Admitting: Adult Health

## 2016-11-25 ENCOUNTER — Encounter: Payer: Self-pay | Admitting: Adult Health

## 2016-12-23 DIAGNOSIS — M546 Pain in thoracic spine: Secondary | ICD-10-CM | POA: Diagnosis not present

## 2016-12-23 DIAGNOSIS — S8001XA Contusion of right knee, initial encounter: Secondary | ICD-10-CM | POA: Diagnosis not present

## 2016-12-23 DIAGNOSIS — S8012XA Contusion of left lower leg, initial encounter: Secondary | ICD-10-CM | POA: Diagnosis not present

## 2016-12-24 ENCOUNTER — Encounter: Payer: Self-pay | Admitting: Adult Health

## 2016-12-25 ENCOUNTER — Telehealth: Payer: Self-pay | Admitting: Adult Health

## 2016-12-25 NOTE — Telephone Encounter (Signed)
Patient called office to check on mychart message she sent yesterday.  Patient states she is having a headache/migraine in a spot about her left ear that started Monday night.  This information is in addition to [employer].  Please call

## 2017-01-02 DIAGNOSIS — S8000XD Contusion of unspecified knee, subsequent encounter: Secondary | ICD-10-CM | POA: Diagnosis not present

## 2017-01-02 DIAGNOSIS — M546 Pain in thoracic spine: Secondary | ICD-10-CM | POA: Diagnosis not present

## 2017-01-09 ENCOUNTER — Encounter: Payer: Self-pay | Admitting: Adult Health

## 2017-01-13 MED ORDER — CYCLOBENZAPRINE HCL 10 MG PO TABS: 10.0000 mg | ORAL_TABLET | Freq: Every day | ORAL | 5 refills | Status: DC

## 2017-02-02 ENCOUNTER — Encounter: Payer: Self-pay | Admitting: Adult Health

## 2017-02-03 NOTE — Telephone Encounter (Signed)
Sandy please start paperwork for Diana Boyd.

## 2017-02-04 ENCOUNTER — Other Ambulatory Visit: Payer: Self-pay | Admitting: Adult Health

## 2017-02-04 MED ORDER — NONFORMULARY OR COMPOUNDED ITEM: 1 refills | Status: DC

## 2017-02-05 NOTE — Progress Notes (Signed)
Fax conmfirmation received ajovy [facility]  [phone] sy

## 2017-02-16 ENCOUNTER — Encounter: Payer: Self-pay | Admitting: Adult Health

## 2017-02-16 MED ORDER — SUMATRIPTAN SUCCINATE 3 MG/0.5ML ~~LOC~~ SOAJ: 3.0000 mg | Freq: Every day | SUBCUTANEOUS | 5 refills | Status: DC | PRN

## 2017-02-17 MED ORDER — SUMATRIPTAN SUCCINATE 6 MG/0.5ML ~~LOC~~ SOAJ: SUBCUTANEOUS | 5 refills | Status: DC

## 2017-02-17 NOTE — Addendum Note (Signed)
Addended by: Enedina Finner on: 02/17/2017 08:44 AM   Modules accepted: Orders

## 2017-02-24 ENCOUNTER — Telehealth: Payer: Self-pay

## 2017-02-24 NOTE — Telephone Encounter (Signed)
PA for aimovig denied. What would you like to do?

## 2017-02-24 NOTE — Telephone Encounter (Signed)
I received a prior auth request for the aimovig prescription. I have completed and submitted the request and should have a determination within 48-72 hours.

## 2017-02-24 NOTE — Telephone Encounter (Signed)
I sent order in for Ajovy

## 2017-02-25 NOTE — Telephone Encounter (Signed)
I called and left a detailed message for patient making her aware that the Aimovig was denied by insurance so we are switching to Ajovy. I advised her to call back if she has any questions or concerns.

## 2017-03-03 NOTE — Telephone Encounter (Signed)
PA initiated thru CoverMyMeds KEY [id number] Ajovy 225mg /1.765ml prefilled syringe.  Called [phone] today and review is in process 03-02-17.

## 2017-03-06 NOTE — Telephone Encounter (Signed)
I LMVM for pt that received from [employer] [facility] that ajovy is not covered by the plan.  (not a covered benefit and excluded from coverage). From a previous email, pt was able to get product at $0 copay,  I was calling to check up.

## 2017-03-09 NOTE — Telephone Encounter (Signed)
Called pt and was given the VM.  I did not LM.

## 2017-03-17 ENCOUNTER — Other Ambulatory Visit: Payer: Self-pay | Admitting: Adult Health

## 2017-03-25 DIAGNOSIS — Z0000 Encounter for general adult medical examination without abnormal findings: Secondary | ICD-10-CM | POA: Diagnosis not present

## 2017-03-25 DIAGNOSIS — Z136 Encounter for screening for cardiovascular disorders: Secondary | ICD-10-CM | POA: Diagnosis not present

## 2017-03-25 DIAGNOSIS — I951 Orthostatic hypotension: Secondary | ICD-10-CM | POA: Diagnosis not present

## 2017-03-31 ENCOUNTER — Other Ambulatory Visit: Payer: Self-pay | Admitting: Adult Health

## 2017-03-31 NOTE — Telephone Encounter (Signed)
LVM informing patient Diana Boyd sent in refill for Ajovy, however she must schedule a follow up with Dr Anne HahnWillis. Advised her the phone staff can schedule her. She needs to be seen the end of Jan or early Feb. Left office number.

## 2017-04-16 ENCOUNTER — Telehealth: Payer: Self-pay | Admitting: Adult Health

## 2017-04-16 NOTE — Telephone Encounter (Signed)
Pt has called stating that when Megan prescribed her Ajovy she told her that she would need to see Dr Anne HahnWillis within the next couple of months(pt has never seen Dr Anne HahnWillis) I have not been able to find any notes supporting this, please call pt

## 2017-04-16 NOTE — Telephone Encounter (Signed)
Called the patient, she is actually a pt of Dr Dohmeier's. I was able to get her in for a apt to see her in March for her follow up

## 2017-05-05 ENCOUNTER — Other Ambulatory Visit: Payer: Self-pay | Admitting: Adult Health

## 2017-06-21 ENCOUNTER — Encounter: Payer: Self-pay | Admitting: Adult Health

## 2017-06-22 MED ORDER — PREDNISONE 5 MG PO TABS: ORAL_TABLET | ORAL | 0 refills | Status: DC

## 2017-07-03 ENCOUNTER — Telehealth: Payer: Self-pay | Admitting: *Deleted

## 2017-07-03 ENCOUNTER — Encounter: Payer: Self-pay | Admitting: Adult Health

## 2017-07-03 NOTE — Telephone Encounter (Signed)
LMVM for pt that received PA request on Emaglity.  Per MM/NP not prescribed.  On Ajovy, having issues with injection site reactions.  Will address with Dr. Vickey Hugerohmeier when in 07-09-17.

## 2017-07-03 NOTE — Telephone Encounter (Signed)
Received fax needs PA on emalgity.  I see no notes relating to trying this that we prescribed.  She is on AJOVY.  She has appt with Dr. Vickey Hugerohmeier in March 7,2019.

## 2017-07-03 NOTE — Telephone Encounter (Signed)
I have not prescribed emgality for her. Last time we spoke by email- she was stopping ajovy because of site reaction and planned to discuss this with Dr. Vickey Hugerohmeier at the office visit. This medication can be discussed next week with Dr. Vickey Hugerohmeier.

## 2017-07-09 ENCOUNTER — Encounter: Payer: Self-pay | Admitting: Neurology

## 2017-07-09 ENCOUNTER — Ambulatory Visit: Payer: 59 | Admitting: Neurology

## 2017-07-09 VITALS — BP 152/90 | HR 72 | Ht 68.0 in | Wt 170.0 lb

## 2017-07-09 DIAGNOSIS — G4730 Sleep apnea, unspecified: Secondary | ICD-10-CM

## 2017-07-09 DIAGNOSIS — J300 Vasomotor rhinitis: Secondary | ICD-10-CM | POA: Insufficient documentation

## 2017-07-09 DIAGNOSIS — G43701 Chronic migraine without aura, not intractable, with status migrainosus: Secondary | ICD-10-CM | POA: Diagnosis not present

## 2017-07-09 DIAGNOSIS — G4710 Hypersomnia, unspecified: Secondary | ICD-10-CM | POA: Insufficient documentation

## 2017-07-09 DIAGNOSIS — G4763 Sleep related bruxism: Secondary | ICD-10-CM | POA: Diagnosis not present

## 2017-07-09 DIAGNOSIS — R0683 Snoring: Secondary | ICD-10-CM | POA: Insufficient documentation

## 2017-07-09 NOTE — Progress Notes (Signed)
PATIENT: Diana Boyd DOB: [birth year]  REASON FOR VISIT: follow up- migraine headaches HISTORY FROM: patient alone   HISTORY OF PRESENT ILLNESS: I have the pleasure to meet 07-09-2017  with Diana Boyd who was last seen in July 2018 by nurse practitioner Butch Penny.  Diana Boyd has a history of migraine headaches , Reynauds and dysautonomia. She has a history of TMJ which may relate to a sleep disorder, has chronic nasal congestion, snores and has insomnia.   Migraine which was not controlled well on various preventives and she needed quite a bit of Imitrex over the months.  She often uses caffeine first and an over-the-counter pain relief before using Imitrex.  In October she was approved for Ajovy and she received the first injection- not here in the office, but at home.  When she developed injection site reactions she describes the skin at the injection site being hard of the size of a softball.  The patient also has a history of Raynaud's syndrome.  It seems that the ajovy helped significantly reduce her migraines.  Coincidentally, Diana Boyd had also filed for [employer] and the patient received a partial wristvig, 5 injections at her home.  She is now concerned should she switch to the eye movement given that she has injection site reactions to a jewelry.  She also states that a prednisone Dosepak did not reduce the skin reaction. Benadryl did not help , neither orally, nor topically.   I would like for her to try Aimovig - since she now has it available- with an auto-injector.  If she has a similar reaction to this second drug in the same category I would assume that it is a categorical allergic reaction and we will not try Emgality.     MM - Date , Diana Boyd is a 47 year old female with a history of migraine headaches. She returns today for follow-up. The patient states that she stopped nortriptyline about a month ago. Reports that it was not beneficial and was causing weight gain. She  states that she essentially has a daily headache. It is normally across the forehead. She does have photophobia but denies phonophobia. She reports that she occasionally has nausea and vomiting as well. She has tried multiple medications in the past including gabapentin, Topamax, beta blockers, Zonegran, baclofen. She states Imitrex does offer her some benefit. She returns today for an evaluation.  HISTORY MM, date  Diana Boyd is a 47 year old female with a history of migraine headaches. She returns today for follow-up. She is currently taking nortriptyline 25 mg at bedtime. She reports that this has been beneficial for her headaches. She has approximately 2 headaches a week. She feels that her headaches are related to her hormones. She states that she does use Imitrex with good benefit however it does have a very slow onset and sometimes it does not resolve her headaches completely. The patient has been on several other medications in the past with minimal benefit. It appears that nortriptyline has offered her the best benefit this far. She also struggles with insomnia. She has never had a sleep study. She does not feel that she snores. She is sometimes tired during the day. She returns today for an evaluation.  HISTORY MM 10/31/15: Diana Boyd is a 47 year old female with a history of migraine headaches. She returns today for follow-up. She states that the Zonegran 75 mg did help with her headaches but it was affecting her memory and attention. We decreased her to 50  mg however her headaches returned. She states that she also has been using baclofen as directed by Dr. Neale Burly for acute therapy however this has also not beneficial. She states that she had 2 severe headaches last week and had Imitrex left over from a previous prescriptions. She state after taking Imitrex her headache resolved within an hour. She states that she typically has daily headaches. These headaches are not always severe. Headaches are  typically located across the forehead and in the temporal region. On occasion she does have photophobia and phonophobia. But denies nausea and vomiting. In the past she has been on gabapentin, Topamax, beta blockers, Zonegran, baclofen with minimal to no benefit. She returns today for an evaluation.  REVIEW OF SYSTEMS: Out of a complete 14 system review of symptoms, the patient complains only of the following symptoms, and all other reviewed systems are negative. Injection site reaction.  Abdominal pain, constipation, light sensitivity, headache, memory loss  ALLERGIES: Allergies  Allergen Reactions  . Septra [Sulfamethoxazole-Trimethoprim]   . Sulfamethoxazole-Trimethoprim Rash    HOME MEDICATIONS: Outpatient Medications Prior to Visit  Medication Sig Dispense Refill  . Biotin 1 MG CAPS Take 1 capsule by mouth daily.    . AJOVY 225 MG/1.5ML SOSY INJECT 1 SYRINGE INTO THE SKIN EVERY MONTH (Patient not taking: Reported on 07/09/2017) 1 Syringe 2  . ALPRAZolam (XANAX) 0.25 MG tablet Take 0.25 mg by mouth at bedtime as needed for sleep.    . Cholecalciferol (VITAMIN D PO) Take 10,000 Units by mouth.     . cyclobenzaprine (FLEXERIL) 10 MG tablet Take 1 tablet (10 mg total) by mouth at bedtime. 30 tablet 5  . Ferrous Sulfate 27 MG TABS Take 27 mg by mouth.    . fludrocortisone (FLORINEF) 0.1 MG tablet Take 0.1 mg by mouth daily.    Marland Kitchen ibuprofen (ADVIL,MOTRIN) 200 MG tablet Take 400 mg by mouth every 4 (four) hours as needed.    . lidocaine (LIDODERM) 5 % Place 1 patch onto the skin daily. Remove & Discard patch within 12 hours or as directed by MD    . Melatonin 10 MG CAPS Take by mouth.    . Multiple Vitamins-Minerals (CENTRUM PO) Take by mouth daily.    . NONFORMULARY OR COMPOUNDED ITEM Ajovy 225mg /1.49mL Inject one subcutaneous q1 month 1 each 1  . norethindrone-ethinyl estradiol (MICROGESTIN,JUNEL,LOESTRIN) 1-20 MG-MCG tablet Take 1 tablet by mouth daily.    . Potassium (POTASSIMIN PO) Take  10 mEq by mouth 2 (two) times daily.     . SUMAtriptan (IMITREX) 25 MG tablet TAKE 1 TABLET BY MOUTH AT ONSET OF HEADACHE. MAY REPEAT IN 2 HOURS IF HEADACHE PERSISTS OR RECURS 10 tablet 5  . SUMAtriptan 6 MG/0.5ML SOAJ Inject 6 mg subQ at the onset of migraine. Can repeat in 2 hours if needed. Not to exceed 12 mg in 24 hours. 0.5 mL 5  . Ginkgo Biloba Extract (GNP GINGKO BILOBA EXTRACT) 60 MG CAPS Take 120 mg by mouth daily.    . predniSONE (DELTASONE) 5 MG tablet Begin taking 6 tablets daily, taper by one tablet daily until off the medication. 21 tablet 0   No facility-administered medications prior to visit.     PAST MEDICAL HISTORY: Past Medical History:  Diagnosis Date  . Migraines   . Orthostatic hypotension   . Palpitations   Raynaud's.   PAST SURGICAL HISTORY: No past surgical history on file.    FAMILY HISTORY: Family History  Problem Relation Age of Onset  .  Prostate cancer Father   . COPD Father     SOCIAL HISTORY: Social History   Socioeconomic History  . Marital status: Married    Spouse name: Not on file  . Number of children: Not on file  . Years of education: Not on file  . Highest education level: Not on file  Social Needs  . Financial resource strain: Not on file  . Food insecurity - worry: Not on file  . Food insecurity - inability: Not on file  . Transportation needs - medical: Not on file  . Transportation needs - non-medical: Not on file  Occupational History  . Not on file  Tobacco Use  . Smoking status: Never Smoker  . Smokeless tobacco: Never Used  Substance and Sexual Activity  . Alcohol use: Not on file  . Drug use: Not on file  . Sexual activity: Not on file  Other Topics Concern  . Not on file  Social History Narrative  . Not on file      PHYSICAL EXAM  Vitals:   07/09/17 0933  BP: (!) 152/90  Pulse: 72  Weight: 170 lb (77.1 kg)  Height: 5\' 8"  (1.727 m)   Body mass index is 25.85 kg/m.  Bluish- purple  fingers and  toes, cold. Generalized: Well developed, in no acute distress . Slightly protruding eyes.   Neurological examination  Mentation: Alert oriented to time, place, and gives  history. Follows all commands  with her speech and language being fluent Cranial nerve : no change in tastte or smell,  Pupils were equally sized, round and reactive to light.  Extraocular movements were full, visual field were full on confrontational test. Facial sensation and strength were normal. Uvula and tongue move midline, no swelling, no airway restriction. . Head turning and shoulder shrug were normal and symmetric. Motor: no neck stiffness- Full 5/ 5 strength inall 4 extremities with symmetric motor tone and mass throughout.  Sensory: Sensory testing is intact to soft touch on all 4 extremities. No evidence of extinction is noted.  Coordination: finger-nose- coordination  bilaterally.  Gait and station: Gait is unaffected/ normal  Reflexes: Deep tendon reflexes are symmetric bilaterally.   DIAGNOSTIC DATA (LABS, IMAGING, TESTING) - I reviewed patient records, labs, notes, testing and imaging myself where available.    Patient advised that gabapentin caused her to weight gain, fluid retention and constipation.  ASSESSMENT AND PLAN 47 y.o. year old female  has a past medical history of Migraines, Orthostatic hypotension, and Palpitations. here with:  1. Migraine headaches 2. Injection site reaction to AJOVY- will change to AIMOVIG , which she has already available.  3. Presyncope with Reynauds, orthostatic hypotension and feeling of hypoglycemia- may make her predisposed to skin reaction.  4. Bruxism, rhinitis, snoring, weight gain. Check for Apnea. Dentist suggested sleep test.    The patient noted a reduction in migraine frequency from 5 days a week, to 2 days a week, lower intensity, too. AJOVY helped.   We discussed Botox should Aimovig not help -and hopefully not cause a similar skin reaction.  The patient  would like to try Aimovig. I reviewed side effects with the patient. I will refill Imitrex po today - RN reviewed how to complete the injection with the patient. Patient is advised that if this is not beneficial for her headaches we can consider Botox. Patient voices understanding. She will follow-up in 3 months with Renelda Loma, NP. She will have a HST meanwhile.   I spent 25  minutes with the patient.  50% of this time was spent discussing the medication Aimovig, injection was supervised here in office.    Melvyn Novasarmen Thelton Graca, MD  07/09/2017, 9:56 AM [facility] [address], [address] [place], [place] [id number] [phone]

## 2017-07-10 ENCOUNTER — Encounter: Payer: Self-pay | Admitting: Neurology

## 2017-07-10 ENCOUNTER — Other Ambulatory Visit: Payer: Self-pay | Admitting: Neurology

## 2017-07-10 ENCOUNTER — Telehealth: Payer: Self-pay | Admitting: Neurology

## 2017-07-10 MED ORDER — SUMATRIPTAN SUCCINATE 3 MG/0.5ML ~~LOC~~ SOAJ: 1.0000 | SUBCUTANEOUS | 2 refills | Status: DC | PRN

## 2017-07-10 MED ORDER — SUMATRIPTAN SUCCINATE 25 MG PO TABS: ORAL_TABLET | ORAL | 5 refills | Status: DC

## 2017-07-10 NOTE — Telephone Encounter (Signed)
Pt presented today and brought her aimovig from home. I educated the patient on how to use and administered her medications from home. The patient brought in 2 inj totally 140mg  and I confirmed with Dr Vickey Hugerohmeier that she did want to give her both doses. The lot num [id number] exp 07/20 NDC [id number]. Pt received the injections in her right thigh. The patient will keep us updated if she has any interactions. She will be due for next dose August 10, 2017. Pt has 3 samples left that aimovig has sent to her. We will wait to order the med once we know if she has tolerated

## 2017-07-13 ENCOUNTER — Other Ambulatory Visit: Payer: Self-pay | Admitting: Neurology

## 2017-07-13 ENCOUNTER — Telehealth: Payer: Self-pay | Admitting: Neurology

## 2017-07-13 MED ORDER — ZEMBRACE SYMTOUCH 3 MG/0.5ML ~~LOC~~ SOAJ: 1.0000 | SUBCUTANEOUS | 2 refills | Status: DC | PRN

## 2017-07-13 NOTE — Telephone Encounter (Signed)
PA completed on cover my meds through [employer]  for Zembrace. KEY [id number]. Can take up to 72 hours

## 2017-07-16 NOTE — Telephone Encounter (Signed)
After sending an appeal letter I received a approval letter today for the patient.   Approved through [employer] until 07/17/2018.  Case number- [id number]

## 2017-07-21 ENCOUNTER — Encounter: Payer: Self-pay | Admitting: Neurology

## 2017-07-27 ENCOUNTER — Telehealth: Payer: Self-pay

## 2017-07-27 NOTE — Telephone Encounter (Signed)
Attempted to schedule HST several times. On 3/25, pt stated that she is busy with a family members health issues and is unable to do HST at this time.  Pt will give us a call back next month to schedule.

## 2017-09-18 ENCOUNTER — Other Ambulatory Visit: Payer: Self-pay | Admitting: Neurology

## 2017-09-18 ENCOUNTER — Encounter: Payer: Self-pay | Admitting: Neurology

## 2017-09-18 MED ORDER — PREDNISONE 10 MG PO TABS: ORAL_TABLET | ORAL | 0 refills | Status: DC

## 2017-09-29 ENCOUNTER — Other Ambulatory Visit: Payer: Self-pay | Admitting: Neurology

## 2017-09-29 ENCOUNTER — Telehealth: Payer: Self-pay | Admitting: Neurology

## 2017-09-29 ENCOUNTER — Encounter: Payer: Self-pay | Admitting: Neurology

## 2017-09-29 MED ORDER — AMITRIPTYLINE HCL 25 MG PO TABS: 25.0000 mg | ORAL_TABLET | Freq: Every day | ORAL | 3 refills | Status: DC

## 2017-09-29 NOTE — Telephone Encounter (Signed)
Response to e mail from this morning.   Dear Diana Boyd,  My headache specialized colleague recommends to continue Aimovig and try elavil at 25- 50  mg with it. If no success, come to the office and she uses BOTOX and Aimovig together- found this helped even most chronic HA cases. I will put an order for you in, as BOTOX needs a longer Pre authorization. Keep up the fight, see you soon.   Melvyn Novas, MD   Cc Desma Maxim,  can you see her for the BoTOX and Aimovig combination? I will put in any order or referral you need from me,  Thank you. Porfirio Mylar

## 2017-09-29 NOTE — Telephone Encounter (Signed)
Absolutely, start the Botox approval process and Daniell can set her up with me. Thank you!

## 2017-10-14 ENCOUNTER — Encounter: Payer: Self-pay | Admitting: Neurology

## 2017-10-29 ENCOUNTER — Encounter: Payer: Self-pay | Admitting: Neurology

## 2017-10-30 ENCOUNTER — Encounter: Payer: Self-pay | Admitting: *Deleted

## 2017-11-08 ENCOUNTER — Other Ambulatory Visit: Payer: Self-pay | Admitting: Neurology

## 2018-01-07 ENCOUNTER — Ambulatory Visit: Payer: 59 | Admitting: Adult Health

## 2018-01-12 ENCOUNTER — Ambulatory Visit: Payer: 59 | Admitting: Adult Health

## 2018-01-13 DIAGNOSIS — J209 Acute bronchitis, unspecified: Secondary | ICD-10-CM | POA: Diagnosis not present

## 2018-01-21 ENCOUNTER — Ambulatory Visit: Payer: 59 | Admitting: Adult Health

## 2018-01-21 ENCOUNTER — Encounter: Payer: Self-pay | Admitting: Adult Health

## 2018-01-21 ENCOUNTER — Telehealth: Payer: Self-pay | Admitting: Adult Health

## 2018-01-21 VITALS — BP 162/88 | HR 73 | Ht 68.0 in | Wt 178.4 lb

## 2018-01-21 DIAGNOSIS — G43019 Migraine without aura, intractable, without status migrainosus: Secondary | ICD-10-CM | POA: Diagnosis not present

## 2018-01-21 NOTE — Telephone Encounter (Signed)
Enrollment form signed by Butch PennyMegan Millikan NP [facility] and demo page. DW

## 2018-01-21 NOTE — Progress Notes (Signed)
PATIENT: Diana Boyd DOB: 1970/06/23  REASON FOR VISIT: follow up HISTORY FROM: patient  HISTORY OF PRESENT ILLNESS: Today 01/21/18:  Diana Boyd is a 47 year old female with a history of migraines.  She returns today for follow-up.  In the past she has tried several medications including gabapentin, Topamax, amitriptyline, beta-blockers, Zonegran, baclofen, Imitrex, Aimovig and Ajovy.  She is currently on Aimovig but has not noticed that the reduction in her headaches.  She reports that she has at least one severe headache a week.  Her headaches tend to occur across the brow line.  She does have photophobia but denies nausea and vomiting.  She has a mild headache daily.  She reports that zembrace typically will resolve her headache within 15 minutes.  She returns today for evaluation.  HISTORY 07-09-2017  with Diana Boyd who was last seen in July 2018 by nurse practitioner Diana Boyd.  Diana Boyd has a history of migraine headaches , Reynauds and dysautonomia. She has a history of TMJ which may relate to a sleep disorder, has chronic nasal congestion, snores and has insomnia.   Migraine which was not controlled well on various preventives and she needed quite a bit of Imitrex over the months.  She often uses caffeine first and an over-the-counter pain relief before using Imitrex.  In October she was approved for Ajovy and she received the first injection- not here in the office, but at home.  When she developed injection site reactions she describes the skin at the injection site being hard of the size of a softball.  The patient also has a history of Raynaud's syndrome.  It seems that the ajovy helped significantly reduce her migraines.  Coincidentally, Diana Boyd had also filed for [employer] and the patient received a partial wristvig, 5 injections at her home.  She is now concerned should she switch to the eye movement given that she has injection site reactions to a jewelry.  She also states  that a prednisone Dosepak did not reduce the skin reaction. Benadryl did not help , neither orally, nor topically.   I would like for her to try Aimovig - since she now has it available- with an auto-injector.  If she has a similar reaction to this second drug in the same category I would assume that it is a categorical allergic reaction and we will not try Emgality.     REVIEW OF SYSTEMS: Out of a complete 14 system review of symptoms, the patient complains only of the following symptoms, and all other reviewed systems are negative.  See HPI  ALLERGIES: Allergies  Allergen Reactions  . Septra [Sulfamethoxazole-Trimethoprim]   . Sulfamethoxazole-Trimethoprim Rash    HOME MEDICATIONS: Outpatient Medications Prior to Visit  Medication Sig Dispense Refill  . ALPRAZolam (XANAX) 0.25 MG tablet Take 0.25 mg by mouth at bedtime as needed for sleep.    Marland Kitchen amitriptyline (ELAVIL) 25 MG tablet Take 1-2 tablets (25-50 mg total) by mouth at bedtime. 60 tablet 3  . Biotin 1 MG CAPS Take 1 capsule by mouth daily.    . Cholecalciferol (VITAMIN D PO) Take 10,000 Units by mouth.     . Ferrous Sulfate 27 MG TABS Take 27 mg by mouth.    . fludrocortisone (FLORINEF) 0.1 MG tablet Take 0.1 mg by mouth daily.    Marland Kitchen ibuprofen (ADVIL,MOTRIN) 200 MG tablet Take 400 mg by mouth every 4 (four) hours as needed.    . lidocaine (LIDODERM) 5 % Place 1 patch onto  the skin daily. Remove & Discard patch within 12 hours or as directed by MD    . Melatonin 10 MG CAPS Take by mouth.    . Multiple Vitamins-Minerals (CENTRUM PO) Take by mouth daily.    . NONFORMULARY OR COMPOUNDED ITEM Ajovy 271m/1.5mL Inject one subcutaneous q1 month 1 each 1  . norethindrone-ethinyl estradiol (MICROGESTIN,JUNEL,LOESTRIN) 1-20 MG-MCG tablet Take 1 tablet by mouth daily.    . Potassium (POTASSIMIN PO) Take 10 mEq by mouth 2 (two) times daily.     . predniSONE (DELTASONE) 10 MG tablet Three in the morning X 3 days, 2 in the morning X 3  days, and then 1 a day X 3 days. 20 tablet 0  . ZEMBRACE SYMTOUCH 3 MG/0.5ML SOAJ Inject 1 Syringe into the skin as needed. 4 pen 2  . ZEMBRACE SYMTOUCH 3 MG/0.5ML SOAJ INJECT 1 SYRINGE INTO THE SKIN AS NEEDED 6 pen 2   No facility-administered medications prior to visit.     PAST MEDICAL HISTORY: Past Medical History:  Diagnosis Date  . Migraines   . Orthostatic hypotension   . Palpitations     PAST SURGICAL HISTORY: No past surgical history on file.  FAMILY HISTORY: Family History  Problem Relation Age of Onset  . Prostate cancer Father   . COPD Father     SOCIAL HISTORY: Social History   Socioeconomic History  . Marital status: Married    Spouse name: Not on file  . Number of children: Not on file  . Years of education: Not on file  . Highest education level: Not on file  Occupational History  . Not on file  Social Needs  . Financial resource strain: Not on file  . Food insecurity:    Worry: Not on file    Inability: Not on file  . Transportation needs:    Medical: Not on file    Non-medical: Not on file  Tobacco Use  . Smoking status: Never Smoker  . Smokeless tobacco: Never Used  Substance and Sexual Activity  . Alcohol use: Not on file  . Drug use: Not on file  . Sexual activity: Not on file  Lifestyle  . Physical activity:    Days per week: Not on file    Minutes per session: Not on file  . Stress: Not on file  Relationships  . Social connections:    Talks on phone: Not on file    Gets together: Not on file    Attends religious service: Not on file    Active member of club or organization: Not on file    Attends meetings of clubs or organizations: Not on file    Relationship status: Not on file  . Intimate partner violence:    Fear of current or ex partner: Not on file    Emotionally abused: Not on file    Physically abused: Not on file    Forced sexual activity: Not on file  Other Topics Concern  . Not on file  Social History Narrative    . Not on file      PHYSICAL EXAM  Vitals:   01/21/18 1458  BP: (!) 162/88  Pulse: 73  Weight: 178 lb 6.4 oz (80.9 kg)  Height: '5\' 8"'  (1.727 m)   Body mass index is 25.85 kg/m.  Generalized: Well developed, in no acute distress   Neurological examination  Mentation: Alert oriented to time, place, history taking. Follows all commands speech and language fluent Cranial nerve II-XII: Pupils  were equal round reactive to light. Extraocular movements were full, visual field were full on confrontational test. Facial sensation and strength were normal. Uvula tongue midline. Head turning and shoulder shrug  were normal and symmetric. Motor: The motor testing reveals 5 over 5 strength of all 4 extremities. Good symmetric motor tone is noted throughout.  Sensory: Sensory testing is intact to soft touch on all 4 extremities. No evidence of extinction is noted.  Coordination: Cerebellar testing reveals good finger-nose-finger and heel-to-shin bilaterally.  Gait and station: Gait is normal.  Reflexes: Deep tendon reflexes are symmetric and normal bilaterally.   DIAGNOSTIC DATA (LABS, IMAGING, TESTING) - I reviewed patient records, labs, notes, testing and imaging myself where available.  Lab Results  Component Value Date   WBC 14.2 (H) 11/11/2006   HGB 10.9 (L) 11/11/2006   HCT 32.4 (L) 11/11/2006   MCV 95.0 11/11/2006   PLT 179 11/11/2006      Component Value Date/Time   NA 139 11/09/2006 1011   K 3.8 11/09/2006 1011   CL 105 11/09/2006 1011   CO2 27 11/09/2006 1011   GLUCOSE 85 11/09/2006 1011   BUN 7 11/09/2006 1011   CREATININE 0.64 11/09/2006 1011   CALCIUM 9.2 11/09/2006 1011   PROT 6.6 11/09/2006 1011   ALBUMIN 2.9 (L) 11/09/2006 1011   AST 22 11/09/2006 1011   ALT 21 11/09/2006 1011   ALKPHOS 288 (H) 11/09/2006 1011   BILITOT 0.4 11/09/2006 1011   GFRNONAA >60 11/09/2006 1011   GFRAA  11/09/2006 1011    >60        The eGFR has been calculated using the MDRD  equation. This calculation has not been validated in all clinical     ASSESSMENT AND PLAN 47 y.o. year old female  has a past medical history of Migraines, Orthostatic hypotension, and Palpitations. here with:  1.  Migraine headaches  The patient essentially has a daily headache with one headache a week being severe.  I recommended trying Botox.  She is amenable.  She has one injection of Aimovig left.  She can continue this.  She should proceed with home sleep test as well.  If her symptoms worsen or she develops new symptoms she should let us know.  She will follow-up in 6 months or sooner if needed.     Diana Givens, MSN, NP-C 01/21/2018, 3:01 PM [facility] [address], [place], [place] [address] [phone]

## 2018-01-21 NOTE — Patient Instructions (Signed)
Your Plan:  Stop Aimovig Call in 3 months to start Botox. If your symptoms worsen or you develop new symptoms please let us know.       Thank you for [facility]. I hope we have been able to provide you high quality care today.  You may receive a patient satisfaction survey over the next few weeks. We would appreciate your feedback and comments so that we may continue to improve ourselves and the health of our patients.

## 2018-01-21 NOTE — Telephone Encounter (Signed)
Patient was brought to me today for a Botox new start, patient would like to schedule for October 15th at 3:30 but I do not have access to override the work in. Would you please scheduled this for me? I went over the patient assistance program with the patient also.

## 2018-01-25 NOTE — Telephone Encounter (Signed)
I placed the appt for 02-16-18 at 1530 with MM/NP for BOTOX.

## 2018-02-01 ENCOUNTER — Ambulatory Visit (INDEPENDENT_AMBULATORY_CARE_PROVIDER_SITE_OTHER): Payer: 59 | Admitting: Neurology

## 2018-02-01 DIAGNOSIS — G4763 Sleep related bruxism: Secondary | ICD-10-CM

## 2018-02-01 DIAGNOSIS — J300 Vasomotor rhinitis: Secondary | ICD-10-CM

## 2018-02-01 DIAGNOSIS — G4710 Hypersomnia, unspecified: Secondary | ICD-10-CM

## 2018-02-01 DIAGNOSIS — R0683 Snoring: Secondary | ICD-10-CM

## 2018-02-01 DIAGNOSIS — G43701 Chronic migraine without aura, not intractable, with status migrainosus: Secondary | ICD-10-CM

## 2018-02-01 DIAGNOSIS — G4730 Sleep apnea, unspecified: Secondary | ICD-10-CM

## 2018-02-07 NOTE — Procedures (Signed)
[facility] [address] [place], [place] [address] NAME:    Diana Boyd                                                              DOB: March 01, 1971 MEDICAL RECORD no:  [id number]                                        DOS:  02/01/2018 REFERRING PHYSICIAN: Butch Penny, NP STUDY PERFORMED: Home Sleep Study on Watch pat. Visit on 07-09-2017 with Diana Boyd who was last seen in July 2018 by NP Butch Penny.  [name] has a history of Migraine headaches, Reynaud's and Dysautonomia. She has a history of TMJ which may relate to a sleep disorder, has chronic nasal congestion, snores and has insomnia. Ajovy has worked for her, reducing Migraines by 50%. She is still tired.  BMI: 25.7  STUDY RESULTS:  Total Recording Time: 9 hours 7 minutes, Valid Sleep Time 6 h and 13 min.  Total Apnea/Hypopnea Index (AHI):  12.7 /h, RDI: 15.3 /h. OSA with mild REM accentuation to AHI 16/h. Average Oxygen Saturation: 96 %; Lowest Oxygen Saturation: 90 %.  Average Heart Rate: 68 bpm (between 52 and 100 bpm) IMPRESSION: Mild Sleep apnea indicated by AI of 12.7/h and moderate snoring. No associated hypoxemia was found.   RECOMMENDATION: The patients fatigue may be partially attributed to Sleep Apnea, albeit Mild OSA.   Treatment can be in form of CPAP, dental device, and will also be archived by avoiding the supine sleep position. Watch pat indicated a much higher AHI in supine sleep. RV with NP.  I certify that I have reviewed the raw data recording prior to the issuance of this report in accordance with the standards of the American Academy of Sleep Medicine (AASM). Melvyn Novas, M.D.   02-07-2018    Medical Director of [facility], accredited by the AASM. Diplomat of the ABPN and ABSM.

## 2018-02-08 NOTE — Telephone Encounter (Signed)
Received a fax requesting a PA on the patients Botox medication. I called [facility] and spoke with the PA line. PA [id number] (05/11/18)

## 2018-02-09 ENCOUNTER — Telehealth: Payer: Self-pay | Admitting: Neurology

## 2018-02-09 NOTE — Telephone Encounter (Signed)
Called to review the patient's sleep study results. Advised the patient that Dr Vickey Huger noted the home sleep test showed mild sleep apnea. I informed the patient on the ways to assist with treating the apnea to hopefully help with her fatigue. I reviewed what CPAP was as well as dental device. I also advised the patient to avoid sleeping on her back since this caused her apnea to worsen. Pt verbalized understanding. Patient would like to think on this and states that she will let us know what she would like do. Pt had no questions at this time but was encouraged to call back if questions arise.

## 2018-02-09 NOTE — Telephone Encounter (Signed)
-----   Message from Melvyn Novas, MD sent at 02/07/2018 11:47 AM EDT ----- Cc Maurice Small, MD  IMPRESSION: Mild Sleep apnea indicated by AHI of 12.7/h and  moderate snoring. No associated hypoxemia was found.  RECOMMENDATION: The patients fatigue may be partially attributed  to Sleep Apnea, albeit Mild OSA.  Treatment can be in form of CPAP, dental device, and will also be  archived by avoiding the supine sleep position.  #Watch pat indicated a much higher AHI in supine sleep. Patient's BMI does not indicate need for weight loss.  RV with NP- discuss treatment options.  Melvyn Novas, MD

## 2018-02-10 NOTE — Telephone Encounter (Signed)
I spoke with [facility] who stated that [facility] is requesting we move the script to [facility]. I asked them to send the script electronically. I called [facility] to set up an account with them.

## 2018-02-12 ENCOUNTER — Encounter: Payer: Self-pay | Admitting: Adult Health

## 2018-02-12 NOTE — Telephone Encounter (Signed)
I called [facility] to check status of the medication transfer. The order is still pending. The representative said that they have received everything needed along with the PA and it should be done by the end of today.

## 2018-02-15 NOTE — Telephone Encounter (Signed)
I called and scheduled delivery. They are pending patient consent so I called the patient to make her aware but she did not answer so I left a VM asking her to call me back.

## 2018-02-16 ENCOUNTER — Ambulatory Visit (INDEPENDENT_AMBULATORY_CARE_PROVIDER_SITE_OTHER): Payer: 59 | Admitting: Adult Health

## 2018-02-16 ENCOUNTER — Telehealth: Payer: Self-pay | Admitting: Adult Health

## 2018-02-16 DIAGNOSIS — G43701 Chronic migraine without aura, not intractable, with status migrainosus: Secondary | ICD-10-CM | POA: Diagnosis not present

## 2018-02-16 NOTE — Progress Notes (Signed)
    Botulism Toxin Injection For Chronic Migraine     BOTOX PROCEDURE NOTE FOR MIGRAINE HEADACHE    Contraindications and precautions discussed with patient(above). Aseptic procedure was observed and patient tolerated procedure. Procedure performed by Dortha Schwalbe  The condition has existed for more than 6 months, and pt does not have a diagnosis of ALS, Myasthenia Gravis or Lambert-Eaton Syndrome.  Risks and benefits of injections discussed and pt agrees to proceed with the procedure.  Written consent obtained  These injections are medically necessary. This is patient's first injections. Informed consent obtained. These injections do not cause sedations or hallucinations which the oral therapies may cause.  Indication/Diagnosis: chronic migraine BOTOX(J0585) injection was performed according to protocol by [employer]. 200 units of BOTOX was dissolved into 4 cc NS.    Botox- 100 units x 2 vials Lot: [id number] Expiration: 08/2020 NDC: 5621-3086-57  Bacteriostatic 0.9% Sodium Chloride- 4mL total Lot: [id number] Expiration: 02/03/2019 NDC: 9528-4132-44  Dx: W10.272   Description of procedure:  The patient was placed in a sitting position. The standard protocol was used for Botox as follows, with 5 units of Botox injected at each site:   -Procerus muscle, midline injection  -Corrugator muscle, bilateral injection  -Frontalis muscle, bilateral injection, with 2 sites each side, medial injection was performed in the upper one third of the frontalis muscle, in the region vertical from the medial inferior edge of the superior orbital rim. The lateral injection was again in the upper one third of the forehead vertically above the lateral limbus of the cornea, 1.5 cm lateral to the medial injection site.  -Temporalis muscle injection, 4 sites, bilaterally. The first injection was 3 cm above the tragus of the ear, second injection site was 1.5 cm to 3 cm up from the first injection site  in line with the tragus of the ear. The third injection site was 1.5-3 cm forward between the first 2 injection sites. The fourth injection site was 1.5 cm posterior to the second injection site.  -Occipitalis muscle injection, 3 sites, bilaterally. The first injection was done one half way between the occipital protuberance and the tip of the mastoid process behind the ear. The second injection site was done lateral and superior to the first, 1 fingerbreadth from the first injection. The third injection site was 1 fingerbreadth superiorly and medially from the first injection site.  -Cervical paraspinal muscle injection, 2 sites, bilateral knee first injection site was 1 cm from the midline of the cervical spine, 3 cm inferior to the lower border of the occipital protuberance. The second injection site was 1.5 cm superiorly and laterally to the first injection site.  -Trapezius muscle injection was performed at 3 sites, bilaterally. The first injection site was in the upper trapezius muscle halfway between the inflection point of the neck, and the acromion. The second injection site was one half way between the acromion and the first injection site. The third injection was done between the first injection site and the inflection point of the neck.   Will return for repeat injection in 3 months.   A 200 unit sof Botox was used, 155 units were injected, the rest of the Botox was wasted. The patient tolerated the procedure well, there were no complications of the above procedure.     Butch Penny, MSN, NP-C 02/16/2018, 4:04 PM [facility] [address], [address] [place], [place] [address] [phone]

## 2018-02-16 NOTE — Telephone Encounter (Signed)
Morning delivery was made, I called to check why medication had no been delivered, the pharmacy was having a system problem and there was no way for them to check.

## 2018-02-16 NOTE — Progress Notes (Signed)
Botox- 100 units x 2 vials Lot: [id number] Expiration: 08/2020 NDC: 4540-9811-91  Bacteriostatic 0.9% Sodium Chloride- 4mL total Lot: [id number] Expiration: 02/03/2019 NDC: 6213-0865-78  Dx: G43.701 S/P

## 2018-02-16 NOTE — Telephone Encounter (Signed)
Patient wants botox appt for Jan 14 at 3:30 pm.

## 2018-02-16 NOTE — Telephone Encounter (Signed)
Can you schedule this- the slot is listed admin. Ok to remove this

## 2018-02-16 NOTE — Telephone Encounter (Signed)
Appointment has been scheduled. Patient is aware.

## 2018-02-18 ENCOUNTER — Telehealth: Payer: Self-pay | Admitting: *Deleted

## 2018-02-18 NOTE — Telephone Encounter (Signed)
PA approval for Zembrace.  07/2017 thru 07/17/2018.  Via CMM.

## 2018-02-22 ENCOUNTER — Telehealth: Payer: Self-pay | Admitting: *Deleted

## 2018-02-22 NOTE — Telephone Encounter (Signed)
Received call from [facility], asking for questions about BOTOX  Prior authorization.  I took message and she gave me Phone # to call [phone].

## 2018-02-22 NOTE — Telephone Encounter (Signed)
Per previous phone note, PA has already been completed. PA [id number] (05/11/18)PA [id number] (05/11/18)

## 2018-04-20 ENCOUNTER — Other Ambulatory Visit (HOSPITAL_COMMUNITY)
Admission: RE | Admit: 2018-04-20 | Discharge: 2018-04-20 | Disposition: A | Discharge status: Home / Self Care | Payer: 59 | Source: Ambulatory Visit | Attending: Family Medicine | Admitting: Family Medicine

## 2018-04-20 ENCOUNTER — Other Ambulatory Visit: Payer: Self-pay | Admitting: Family Medicine

## 2018-04-20 DIAGNOSIS — Z0000 Encounter for general adult medical examination without abnormal findings: Secondary | ICD-10-CM | POA: Diagnosis not present

## 2018-04-20 DIAGNOSIS — Z124 Encounter for screening for malignant neoplasm of cervix: Secondary | ICD-10-CM | POA: Diagnosis present

## 2018-04-20 DIAGNOSIS — Z13220 Encounter for screening for lipoid disorders: Secondary | ICD-10-CM | POA: Diagnosis not present

## 2018-04-23 LAB — CYTOLOGY - PAP: Diagnosis: NEGATIVE

## 2018-05-11 NOTE — Addendum Note (Signed)
Addended by: Hermenia Fiscal S on: 05/11/2018 05:00 PM   Modules accepted: Orders

## 2018-05-11 NOTE — Telephone Encounter (Signed)
Could you please send an electronic script to [facility] [place]?

## 2018-05-12 MED ORDER — ONABOTULINUMTOXINA 100 UNITS IJ SOLR: INTRAMUSCULAR | 3 refills | Status: DC

## 2018-05-12 NOTE — Addendum Note (Signed)
Addended by: Guy Begin on: 05/12/2018 08:59 AM   Modules accepted: Orders

## 2018-05-12 NOTE — Telephone Encounter (Signed)
Did not give me [place] option,  Used [place] [facility].

## 2018-05-14 NOTE — Telephone Encounter (Signed)
I called Diana Boyd to complete PA. They were wanting results of how the patient responded to the first injection. We have no clinicals since her previous injection. I called the patient to inquire about this but she did not answer so I left a VM asking her to call me back.

## 2018-05-14 NOTE — Telephone Encounter (Signed)
Refill is pending with [facility] until PA is complete. [facility] transferred script due to insurance requirements. DW

## 2018-05-14 NOTE — Telephone Encounter (Signed)
Patient called back and I spoke with her regarding the Botox. She states that she is still having daily headaches and has not received much benefit from the Botox. She does not meet the requirements for approval of Botox. She wants to discuss this with the nurse and see what her other options are. Please advise.

## 2018-05-14 NOTE — Telephone Encounter (Signed)
LMVM for pt that returned call.  Will have to touch base on Monday.

## 2018-05-16 ENCOUNTER — Encounter: Payer: Self-pay | Admitting: Adult Health

## 2018-05-17 ENCOUNTER — Telehealth: Payer: Self-pay | Admitting: Adult Health

## 2018-05-17 NOTE — Telephone Encounter (Signed)
Pt has appt, with MM/NP.

## 2018-05-17 NOTE — Telephone Encounter (Signed)
Patient made aware through my chart message: Aundra Millet NP advised that you should keep your apt this week and she will discuss other options with you. The patient replied "thank you." her FU is on 05/18/18.

## 2018-05-17 NOTE — Telephone Encounter (Signed)
Pt states she wrote you on the portal and would like to know if she has to come in to her BOTOX appt. Please advise.

## 2018-05-17 NOTE — Telephone Encounter (Signed)
I called the patient back and made her aware that she cannot get Botox. I told her we left her on the schedule and will let her know once we hear from [facility] about to proceed.

## 2018-05-18 ENCOUNTER — Ambulatory Visit (INDEPENDENT_AMBULATORY_CARE_PROVIDER_SITE_OTHER): Payer: 59 | Admitting: Adult Health

## 2018-05-18 ENCOUNTER — Other Ambulatory Visit: Payer: Self-pay | Admitting: Family Medicine

## 2018-05-18 ENCOUNTER — Encounter: Payer: Self-pay | Admitting: Adult Health

## 2018-05-18 VITALS — BP 142/88 | HR 78 | Ht 68.0 in | Wt 178.2 lb

## 2018-05-18 DIAGNOSIS — Z1231 Encounter for screening mammogram for malignant neoplasm of breast: Secondary | ICD-10-CM

## 2018-05-18 DIAGNOSIS — G43019 Migraine without aura, intractable, without status migrainosus: Secondary | ICD-10-CM | POA: Diagnosis not present

## 2018-05-18 NOTE — Progress Notes (Signed)
PATIENT: Diana Boyd DOB: 1970-10-12  REASON FOR VISIT: follow up HISTORY FROM: patient  HISTORY OF PRESENT ILLNESS: Today 05/18/18:  Diana Boyd is a 48 year old female with a history of intractable migraine headaches.  She returns today for follow-up.  She continues to have daily headaches.  She has approximately 10 severe headaches a week.  Her headaches tend to occur in the frontal region.  She reports photophobia and on occasion may be mild photophobia.  She denies nausea or vomiting.  No changes in her vision.  She does not have an aura.  She tends to wake up with headaches.  She does state drinking caffeine offers her some benefit.  She has had a sleep study in the past that showed mild sleep apnea.  She is currently seeing a dentist who has given her a nasal dilator.  The patient has tried multiple medications in the past including Topamax, amitriptyline, beta-blockers, Zonegran, baclofen, Imitrex, Fioricet, Flexeril, gabapentin, nortriptyline Aimovig, ajovy and Botox.  She returns today for follow-up.  HISTORY 01/21/18:  Diana Boyd is a 48 year old female with a history of migraines.  She returns today for follow-up.  In the past she has tried several medications including gabapentin, Topamax, amitriptyline, beta-blockers, Zonegran, baclofen, Imitrex, Aimovig and Ajovy.  She is currently on Aimovig but has not noticed that the reduction in her headaches.  She reports that she has at least one severe headache a week.  Her headaches tend to occur across the brow line.  She does have photophobia but denies nausea and vomiting.  She has a mild headache daily.  She reports that zembrace typically will resolve her headache within 15 minutes.  She returns today for evaluation.  REVIEW OF SYSTEMS: Out of a complete 14 system review of symptoms, the patient complains only of the following symptoms, and all other reviewed systems are negative.  See HPI  ALLERGIES: Allergies  Allergen Reactions    . Septra [Sulfamethoxazole-Trimethoprim]   . Sulfamethoxazole-Trimethoprim Rash    HOME MEDICATIONS: Outpatient Medications Prior to Visit  Medication Sig Dispense Refill  . ALPRAZolam (XANAX) 0.25 MG tablet Take 0.25 mg by mouth at bedtime as needed for sleep.    . Ascorbic Acid (VITAMIN C) 250 MG CHEW Chew by mouth. Once daily.    . Biotin 1 MG CAPS Take 1 capsule by mouth daily.    . Cholecalciferol (VITAMIN D PO) Take 2,000 Units by mouth.     . diphenhydrAMINE (BENADRYL) 25 MG tablet Take 25-50 mg by mouth at bedtime.    . Ferrous Sulfate 27 MG TABS Take 27 mg by mouth.    . Flaxseed, Linseed, (FLAX SEED OIL PO) Take by mouth.    . fludrocortisone (FLORINEF) 0.1 MG tablet Take 0.2 mg by mouth 2 (two) times daily.     Marland Kitchen ibuprofen (ADVIL,MOTRIN) 200 MG tablet Take 400 mg by mouth every 4 (four) hours as needed.    . lidocaine (LIDODERM) 5 % Place 1 patch onto the skin daily. Remove & Discard patch within 12 hours or as directed by MD    . Melatonin 10 MG CAPS Take by mouth at bedtime.     . norethindrone-ethinyl estradiol (MICROGESTIN,JUNEL,LOESTRIN) 1-20 MG-MCG tablet Take 1 tablet by mouth daily.    . Potassium (POTASSIMIN PO) Take 10 mEq by mouth 2 (two) times daily.     . potassium chloride (K-DUR) 10 MEQ tablet TK 1 T PO BID  3  . PROAIR HFA 108 (90 Base) MCG/ACT inhaler  Prn  0  . traMADol (ULTRAM) 50 MG tablet Take 50 mg by mouth.    Marland Kitchen ZEMBRACE SYMTOUCH 3 MG/0.5ML SOAJ INJECT 1 SYRINGE INTO THE SKIN AS NEEDED 6 pen 2  . botulinum toxin Type A (BOTOX) 100 units SOLR injection Inject 155 units Intramuscularly into head and neck muscles every 3 months by the medical provider. Quantity 2 vials. 155 Units 3  . Erenumab-aooe (AIMOVIG) 140 MG/ML SOAJ Inject into the skin. Every 30 days    . predniSONE (DELTASONE) 10 MG tablet Three in the morning X 3 days, 2 in the morning X 3 days, and then 1 a day X 3 days. (Patient not taking: Reported on 05/18/2018) 20 tablet 0   No  facility-administered medications prior to visit.     PAST MEDICAL HISTORY: Past Medical History:  Diagnosis Date  . Migraines   . Orthostatic hypotension   . Palpitations     PAST SURGICAL HISTORY: No past surgical history on file.  FAMILY HISTORY: Family History  Problem Relation Age of Onset  . Prostate cancer Father   . COPD Father     SOCIAL HISTORY: Social History   Socioeconomic History  . Marital status: Married    Spouse name: Not on file  . Number of children: Not on file  . Years of education: Not on file  . Highest education level: Not on file  Occupational History  . Not on file  Social Needs  . Financial resource strain: Not on file  . Food insecurity:    Worry: Not on file    Inability: Not on file  . Transportation needs:    Medical: Not on file    Non-medical: Not on file  Tobacco Use  . Smoking status: Never Smoker  . Smokeless tobacco: Never Used  Substance and Sexual Activity  . Alcohol use: Not on file  . Drug use: Not on file  . Sexual activity: Not on file  Lifestyle  . Physical activity:    Days per week: Not on file    Minutes per session: Not on file  . Stress: Not on file  Relationships  . Social connections:    Talks on phone: Not on file    Gets together: Not on file    Attends religious service: Not on file    Active member of club or organization: Not on file    Attends meetings of clubs or organizations: Not on file    Relationship status: Not on file  . Intimate partner violence:    Fear of current or ex partner: Not on file    Emotionally abused: Not on file    Physically abused: Not on file    Forced sexual activity: Not on file  Other Topics Concern  . Not on file  Social History Narrative  . Not on file      PHYSICAL EXAM  Vitals:   05/18/18 1527  BP: (!) 142/88  Pulse: 78  Weight: 178 lb 3.2 oz (80.8 kg)  Height: '5\' 8"'  (1.727 m)   Body mass index is 27.1 kg/m.  Generalized: Well developed, in no  acute distress   Neurological examination  Mentation: Alert oriented to time, place, history taking. Follows all commands speech and language fluent Cranial nerve II-XII: Pupils were equal round reactive to light. Extraocular movements were full, visual field were full on confrontational test. Facial sensation and strength were normal. Uvula tongue midline. Head turning and shoulder shrug  were normal and symmetric.  Motor: The motor testing reveals 5 over 5 strength of all 4 extremities. Good symmetric motor tone is noted throughout.  Sensory: Sensory testing is intact to soft touch on all 4 extremities. No evidence of extinction is noted.  Coordination: Cerebellar testing reveals good finger-nose-finger and heel-to-shin bilaterally.  Gait and station: Gait is normal.  Reflexes: Deep tendon reflexes are symmetric and normal bilaterally.   DIAGNOSTIC DATA (LABS, IMAGING, TESTING) - I reviewed patient records, labs, notes, testing and imaging myself where available.  Lab Results  Component Value Date   WBC 14.2 (H) 11/11/2006   HGB 10.9 (L) 11/11/2006   HCT 32.4 (L) 11/11/2006   MCV 95.0 11/11/2006   PLT 179 11/11/2006      Component Value Date/Time   NA 139 11/09/2006 1011   K 3.8 11/09/2006 1011   CL 105 11/09/2006 1011   CO2 27 11/09/2006 1011   GLUCOSE 85 11/09/2006 1011   BUN 7 11/09/2006 1011   CREATININE 0.64 11/09/2006 1011   CALCIUM 9.2 11/09/2006 1011   PROT 6.6 11/09/2006 1011   ALBUMIN 2.9 (L) 11/09/2006 1011   AST 22 11/09/2006 1011   ALT 21 11/09/2006 1011   ALKPHOS 288 (H) 11/09/2006 1011   BILITOT 0.4 11/09/2006 1011   GFRNONAA >60 11/09/2006 1011   GFRAA  11/09/2006 1011    >60        The eGFR has been calculated using the MDRD equation. This calculation has not been validated in all clinical     ASSESSMENT AND PLAN 48 y.o. year old female  has a past medical history of Migraines, Orthostatic hypotension, and Palpitations. here with:  1.  Intractable  migraine headaches  The patient has tried a multitude of medications with little or no benefit.  We discussed possibly seeking a second opinion for treatment of her headaches.  She is amenable to this plan.  I will put a referral into headache clinic for a second opinion.  She is advised that if her symptoms worsen or she develops new symptoms she should let us know.  She will follow-up after her second opinion if warranted.  I spent 15 minutes with the patient. 50% of this time was spent discussing plan of care   Ward Givens, MSN, NP-C 05/18/2018, 3:38 PM [facility] [address], [place], [place] [address] [phone]

## 2018-05-18 NOTE — Patient Instructions (Addendum)
Your Plan:  Continue imitrex If your symptoms worsen or you develop new symptoms please let us know.   Thank you for coming to see [place] at [facility]. I hope we have been able to provide you high quality care today.  You may receive a patient satisfaction survey over the next few weeks. We would appreciate your feedback and comments so that we may continue to improve ourselves and the health of our patients.

## 2018-05-19 ENCOUNTER — Encounter: Payer: Self-pay | Admitting: Adult Health

## 2018-06-08 ENCOUNTER — Telehealth: Payer: Self-pay | Admitting: *Deleted

## 2018-06-08 NOTE — Telephone Encounter (Signed)
Received fax from [facility] about zembrace needing PA.  Last noted approval was until 07-17-18.   Called pharmacy and has deductible this yr to fill $1353.02.  Coupon expired.  Pt employee at [employer] he will speak to her.  ? Needing PA since still has until 07-17-18.  Will not do PA at this time.

## 2018-06-16 DIAGNOSIS — J209 Acute bronchitis, unspecified: Secondary | ICD-10-CM | POA: Diagnosis not present

## 2018-06-21 ENCOUNTER — Ambulatory Visit
Admission: RE | Admit: 2018-06-21 | Discharge: 2018-06-21 | Disposition: A | Discharge status: Home / Self Care | Payer: 59 | Source: Ambulatory Visit | Attending: Family Medicine | Admitting: Family Medicine

## 2018-06-21 DIAGNOSIS — Z1231 Encounter for screening mammogram for malignant neoplasm of breast: Secondary | ICD-10-CM

## 2018-06-30 DIAGNOSIS — J45909 Unspecified asthma, uncomplicated: Secondary | ICD-10-CM | POA: Diagnosis not present

## 2018-07-01 ENCOUNTER — Other Ambulatory Visit: Payer: Self-pay | Admitting: Neurology

## 2018-07-05 ENCOUNTER — Telehealth: Payer: Self-pay | Admitting: Adult Health

## 2018-07-05 NOTE — Telephone Encounter (Signed)
[facility] [phone] states they had a request for botox refill, called to advise it needs PA. Please call to advise

## 2018-07-06 NOTE — Telephone Encounter (Signed)
Patient does not currently have Botox injection scheduled.

## 2018-07-09 ENCOUNTER — Ambulatory Visit
Admission: RE | Admit: 2018-07-09 | Discharge: 2018-07-09 | Disposition: A | Discharge status: Home / Self Care | Payer: 59 | Source: Ambulatory Visit | Attending: Physician Assistant | Admitting: Physician Assistant

## 2018-07-09 ENCOUNTER — Other Ambulatory Visit: Payer: Self-pay | Admitting: Physician Assistant

## 2018-07-09 DIAGNOSIS — R062 Wheezing: Secondary | ICD-10-CM | POA: Diagnosis not present

## 2018-07-09 DIAGNOSIS — R059 Cough, unspecified: Secondary | ICD-10-CM

## 2018-07-09 DIAGNOSIS — R05 Cough: Secondary | ICD-10-CM | POA: Diagnosis not present

## 2018-08-29 ENCOUNTER — Encounter: Payer: Self-pay | Admitting: Adult Health

## 2018-08-30 ENCOUNTER — Encounter: Payer: Self-pay | Admitting: Neurology

## 2018-08-30 NOTE — Telephone Encounter (Signed)
  Andrey Campanile, It seems that Jeffrey's migraines are doing rather well- the new headache she attributed to the wearing of paper masks is in a different location.  forehead- I would like for her to tell us if her headaches get worse when bending forward or sneezing, any Valsalva. This would indicate sinus component.    Melvyn Novas, MD

## 2018-09-03 ENCOUNTER — Encounter: Payer: Self-pay | Admitting: Neurology

## 2018-09-06 ENCOUNTER — Telehealth: Payer: Self-pay | Admitting: *Deleted

## 2018-09-06 NOTE — Telephone Encounter (Signed)
Received email from pt. Re: referral to [facility], this will be too long.  She would like to go to Dr. Annia Belt 09-17-18 at 0845.  Referral to be placed by Dr. Valentina Lucks.  Dr. Lennie Muckle info is [id number]  [place] [id number], [phone] phone.  Fax ?

## 2018-09-09 NOTE — Telephone Encounter (Signed)
Noted Faxed telephone [phone] fax [phone]

## 2018-09-17 DIAGNOSIS — G43709 Chronic migraine without aura, not intractable, without status migrainosus: Secondary | ICD-10-CM | POA: Diagnosis not present

## 2019-07-28 ENCOUNTER — Other Ambulatory Visit: Payer: Self-pay | Admitting: Family Medicine

## 2019-07-28 DIAGNOSIS — Z1231 Encounter for screening mammogram for malignant neoplasm of breast: Secondary | ICD-10-CM

## 2019-08-29 ENCOUNTER — Other Ambulatory Visit: Payer: Self-pay

## 2019-08-29 ENCOUNTER — Ambulatory Visit
Admission: RE | Admit: 2019-08-29 | Discharge: 2019-08-29 | Disposition: A | Discharge status: Home / Self Care | Payer: 59 | Source: Ambulatory Visit | Attending: Family Medicine | Admitting: Family Medicine

## 2019-08-29 DIAGNOSIS — Z1231 Encounter for screening mammogram for malignant neoplasm of breast: Secondary | ICD-10-CM

## 2020-01-20 IMAGING — MG DIGITAL SCREENING BILATERAL MAMMOGRAM WITH CAD
4 series · 4 of 4 positions shown · non-contrast
Comparison: Previous exam(s).

CLINICAL DATA: Screening.

EXAM:
DIGITAL SCREENING BILATERAL MAMMOGRAM WITH CAD

[R CC]
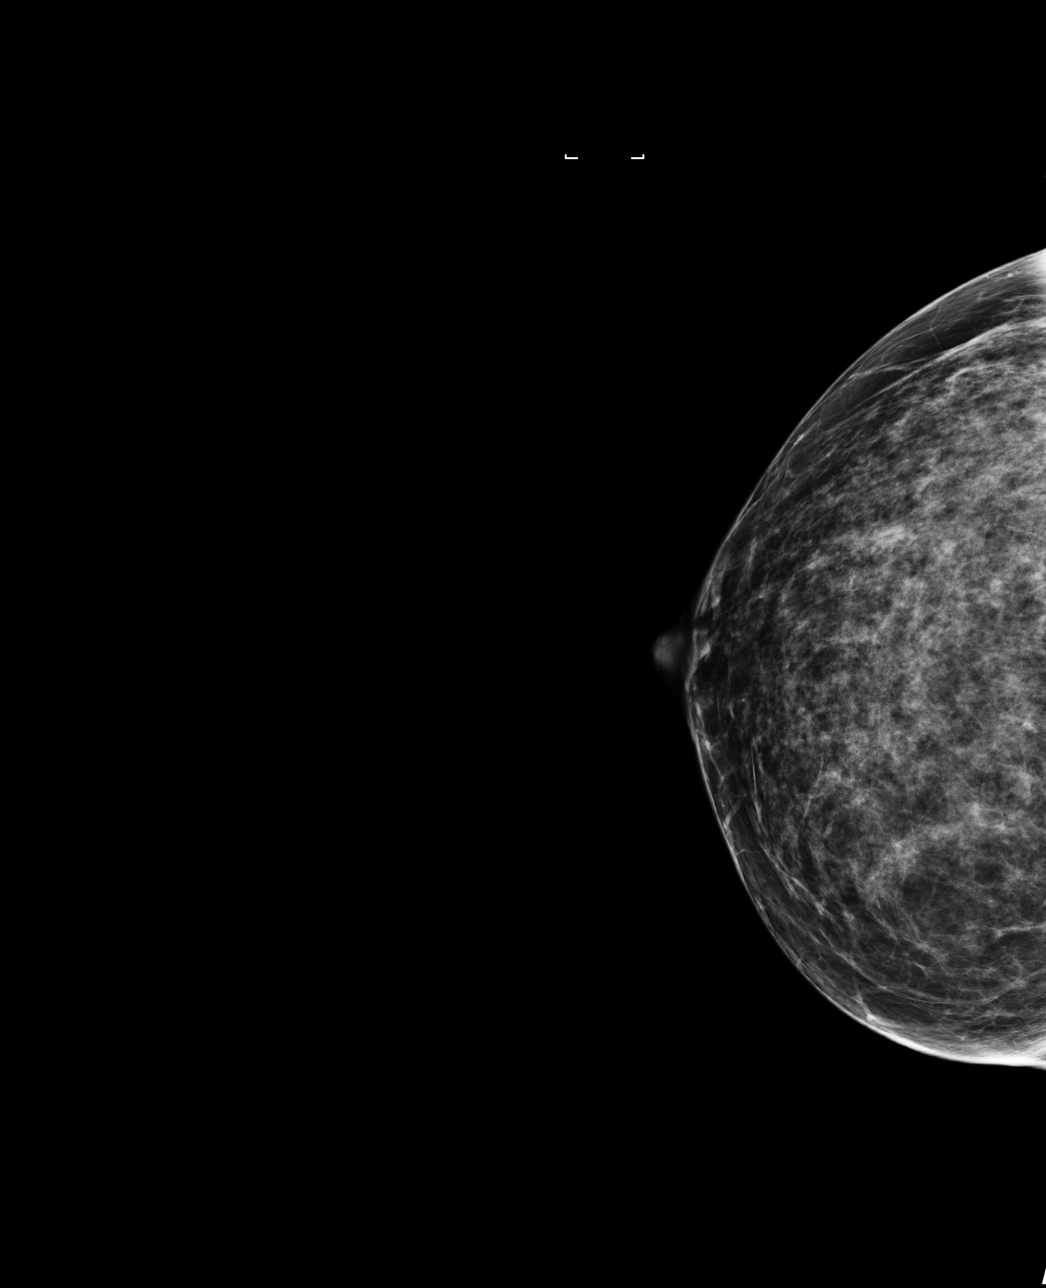

[R MLO]
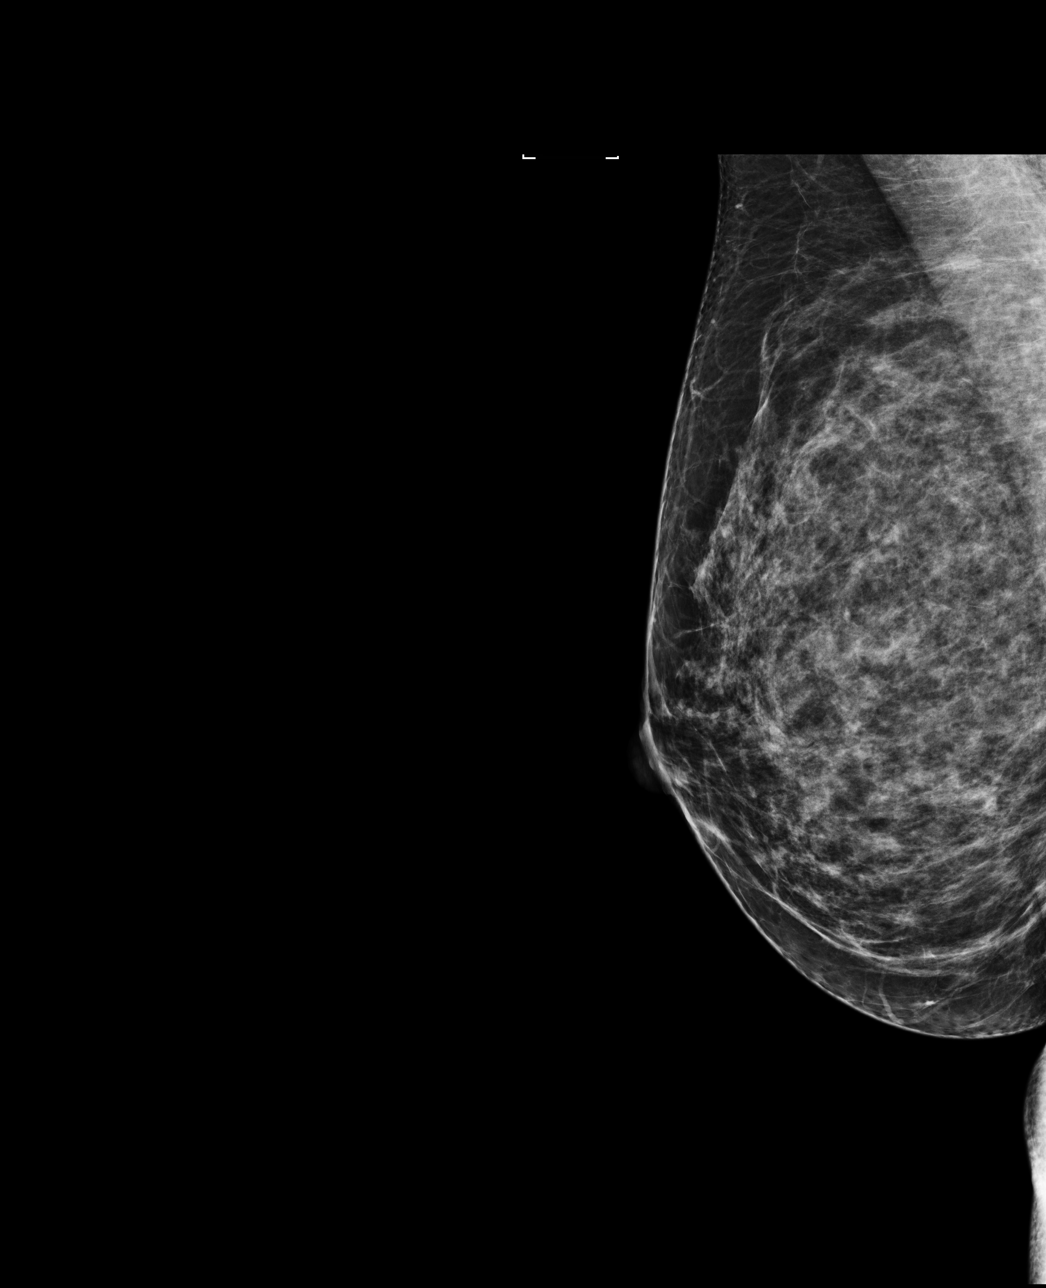

[L MLO]
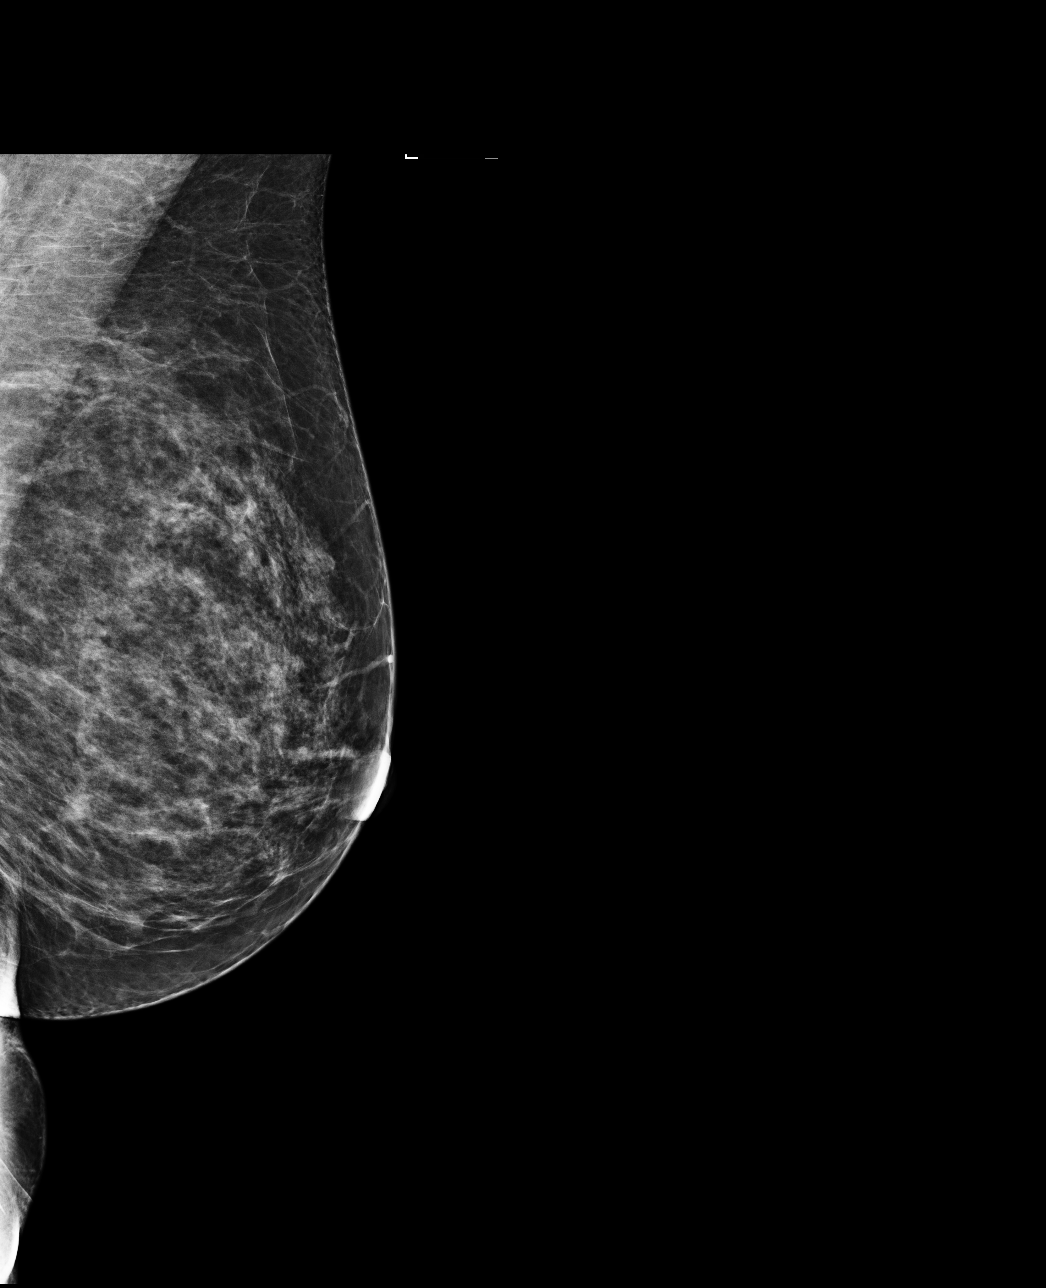

[L CC]
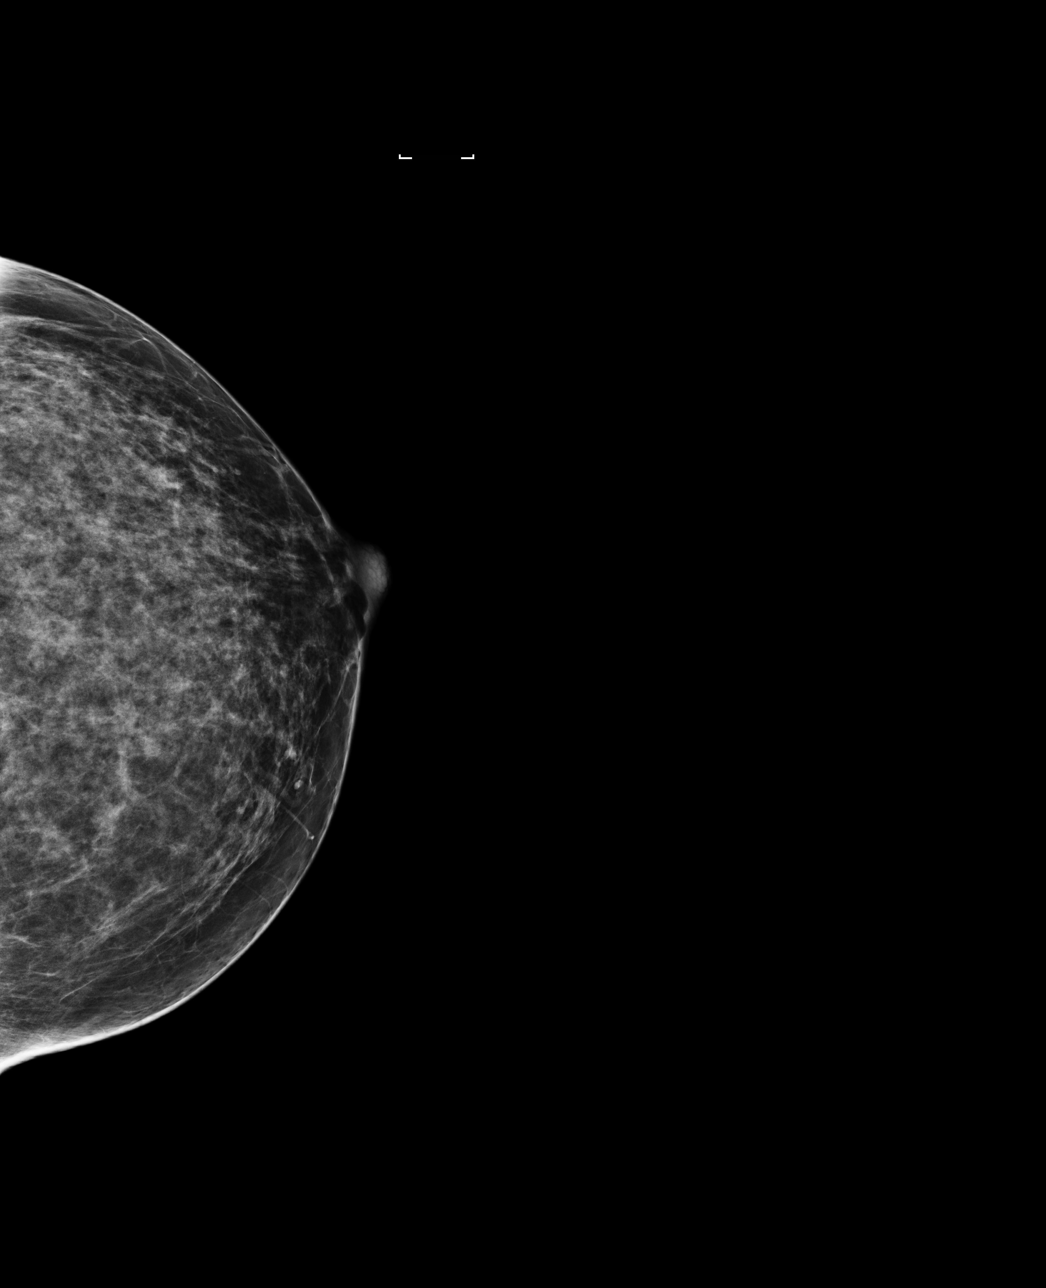

[4 of 4 positions shown; findings below may reference images not displayed]

ACR Breast Density Category c: The breast tissue is heterogeneously
dense, which may obscure small masses.
FINDINGS: There are no findings suspicious for malignancy. Images were
processed with CAD.
IMPRESSION: No mammographic evidence of malignancy. A result letter of this
screening mammogram will be mailed directly to the patient.

RECOMMENDATION:
Screening mammogram in one year. (Code:[id number])

BI-RADS CATEGORY  1: Negative.

## 2020-05-27 ENCOUNTER — Encounter (HOSPITAL_BASED_OUTPATIENT_CLINIC_OR_DEPARTMENT_OTHER): Payer: Self-pay

## 2020-05-27 ENCOUNTER — Other Ambulatory Visit: Payer: Self-pay

## 2020-05-27 ENCOUNTER — Emergency Department (HOSPITAL_BASED_OUTPATIENT_CLINIC_OR_DEPARTMENT_OTHER)
Admission: EM | Admit: 2020-05-27 | Discharge: 2020-05-27 | Disposition: A | Discharge status: Home / Self Care | Payer: 59 | Attending: Emergency Medicine | Admitting: Emergency Medicine

## 2020-05-27 ENCOUNTER — Emergency Department (HOSPITAL_BASED_OUTPATIENT_CLINIC_OR_DEPARTMENT_OTHER): Payer: 59

## 2020-05-27 DIAGNOSIS — J45901 Unspecified asthma with (acute) exacerbation: Secondary | ICD-10-CM | POA: Insufficient documentation

## 2020-05-27 DIAGNOSIS — U071 COVID-19: Secondary | ICD-10-CM | POA: Insufficient documentation

## 2020-05-27 DIAGNOSIS — J1282 Pneumonia due to coronavirus disease 2019: Secondary | ICD-10-CM | POA: Insufficient documentation

## 2020-05-27 DIAGNOSIS — R0602 Shortness of breath: Secondary | ICD-10-CM | POA: Diagnosis present

## 2020-05-27 HISTORY — DX: Irritable bowel syndrome, unspecified: K58.9

## 2020-05-27 HISTORY — DX: Unspecified asthma, uncomplicated: J45.909

## 2020-05-27 LAB — BASIC METABOLIC PANEL
Anion gap: 11 (ref 5–15)
BUN: 9 mg/dL (ref 6–20)
CO2: 30 mmol/L (ref 22–32)
Calcium: 8.5 mg/dL — ABNORMAL LOW (ref 8.9–10.3)
Chloride: 100 mmol/L (ref 98–111)
Creatinine, Ser: 0.82 mg/dL (ref 0.44–1.00)
GFR, Estimated: >60 mL/min (ref >60)
Glucose, Bld: 136 mg/dL — ABNORMAL HIGH (ref 70–99)
Potassium: 3.3 mmol/L — ABNORMAL LOW (ref 3.5–5.1)
Sodium: 141 mmol/L (ref 135–145)

## 2020-05-27 LAB — CBC
HCT: 38.4 % (ref 36.0–46.0)
Hemoglobin: 12.6 g/dL (ref 12.0–15.0)
MCH: 30.7 pg (ref 26.0–34.0)
MCHC: 32.8 g/dL (ref 30.0–36.0)
MCV: 93.4 fL (ref 80.0–100.0)
Platelets: 343 10*3/uL (ref 150–400)
RBC: 4.11 MIL/uL (ref 3.87–5.11)
RDW: 13.2 % (ref 11.5–15.5)
WBC: 5.5 10*3/uL (ref 4.0–10.5)
nRBC: 0 % (ref 0.0–0.2)

## 2020-05-27 LAB — PREGNANCY, URINE: Preg Test, Ur: NEGATIVE

## 2020-05-27 LAB — TROPONIN I (HIGH SENSITIVITY): Troponin I (High Sensitivity): 5 ng/L (ref <18)

## 2020-05-27 MED ORDER — IOHEXOL 350 MG/ML SOLN
100.0000 mL | Freq: Once | INTRAVENOUS | Status: AC | PRN
  Administered 2020-05-27: 80 mL via INTRAVENOUS

## 2020-05-27 MED ORDER — IPRATROPIUM BROMIDE HFA 17 MCG/ACT IN AERS
4.0000 | INHALATION_SPRAY | Freq: Once | RESPIRATORY_TRACT | Status: AC
  Administered 2020-05-27: 4 via RESPIRATORY_TRACT
  Filled 2020-05-27: qty 12.9

## 2020-05-27 MED ORDER — AEROCHAMBER PLUS FLO-VU MEDIUM MISC
1.0000 | Freq: Once | Status: AC
  Administered 2020-05-27: 1
  Filled 2020-05-27: qty 1

## 2020-05-27 MED ORDER — METHYLPREDNISOLONE SODIUM SUCC 125 MG IJ SOLR
125.0000 mg | Freq: Once | INTRAMUSCULAR | Status: AC
  Administered 2020-05-27: 125 mg via INTRAVENOUS
  Filled 2020-05-27: qty 2

## 2020-05-27 MED ORDER — ALBUTEROL SULFATE HFA 108 (90 BASE) MCG/ACT IN AERS
8.0000 | INHALATION_SPRAY | Freq: Once | RESPIRATORY_TRACT | Status: AC
  Administered 2020-05-27: 8 via RESPIRATORY_TRACT
  Filled 2020-05-27: qty 6.7

## 2020-05-27 MED ORDER — PREDNISONE 20 MG PO TABS: 20.0000 mg | ORAL_TABLET | Freq: Every day | ORAL | 0 refills | Status: AC

## 2020-05-27 NOTE — Discharge Instructions (Signed)
Continue using your albuterol as needed. You can take 1 puff of the ipratroprium every 6 hours as needed. Starting tomorrow, take the prednisone daily until gone. Monitor your oxygen saturation percentage with a pulse oximeter.  Follow closely with your PCP. Return to the ED for oxygen percentage staying below 90%, severely worsening chest pain or shortness of breath.

## 2020-05-27 NOTE — ED Triage Notes (Signed)
Pt tested positive for COVID on 1/9. Started to feel short of breath on exertion on 1/15. Midsternal chest pain with coughing.

## 2020-05-27 NOTE — ED Notes (Signed)
Patient ambulated in room due to covid procedures. Got more SOB when walking, but lowest SAT was 93%. RT to monitor.

## 2020-05-27 NOTE — ED Provider Notes (Signed)
[facility] EMERGENCY DEPARTMENT Provider Note   CSN: [id number] Arrival date & time: 05/27/20  1312     History Chief Complaint  Patient presents with  . Shortness of Breath  . Chest Pain    Diana Boyd is a 50 y.o. female w PMHx orthostatic hypotension, chronic migraine, asthma, IBS, presenting to the emergency department with complaint of persisting shortness of breath in the setting of COVID-19 illness.  Patient states symptoms initially began on 05/13/2020, with positive test the following day.  Initially had typical COVID symptoms.  About 1 week ago she developed shortness of breath and wheezing.  Her shortness of breath is minimal at rest, worse with exertion. Her symptoms feel more related to asthma though are not improving with ProAir inhaler. She states sometimes she has some central chest pain with deep breathing.  Denies leg swelling or pain.  Is not vaccinated against COVID.  The history is provided by the patient.       Past Medical History:  Diagnosis Date  . Asthma   . Irritable bowel syndrome (IBS)   . Migraines   . Orthostatic hypotension   . Palpitations     Patient Active Problem List   Diagnosis Date Noted  . Chronic migraine without aura with status migrainosus, not intractable 07/09/2017  . Chronic vasomotor rhinitis 07/09/2017  . Snoring 07/09/2017  . Hypersomnia with sleep apnea 07/09/2017  . Bruxism, sleep-related 07/09/2017  . Orthostatic hypotension 09/30/2012    History reviewed. No pertinent surgical history.   OB History   No obstetric history on file.     Family History  Problem Relation Age of Onset  . Prostate cancer Father   . COPD Father     Social History   Tobacco Use  . Smoking status: Never Smoker  . Smokeless tobacco: Never Used  Substance Use Topics  . Alcohol use: Never  . Drug use: Never    Home Medications Prior to Admission medications   Medication Sig Start Date End Date Taking? Authorizing  Provider  predniSONE (DELTASONE) 20 MG tablet Take 1 tablet (20 mg total) by mouth daily for 5 days. 05/27/20 06/01/20 Yes Roxan Hockey, [place] N, PA-C  ALPRAZolam Prudy Feeler) 0.25 MG tablet Take 0.25 mg by mouth at bedtime as needed for sleep.    [provider]  Ascorbic Acid (VITAMIN C) 250 MG CHEW Chew by mouth. Once daily.    [provider]  Biotin 1 MG CAPS Take 1 capsule by mouth daily.    [provider]  Cholecalciferol (VITAMIN D PO) Take 2,000 Units by mouth.     [provider]  diphenhydrAMINE (BENADRYL) 25 MG tablet Take 25-50 mg by mouth at bedtime.    [provider]  Ferrous Sulfate 27 MG TABS Take 27 mg by mouth.    [provider]  Flaxseed, Linseed, (FLAX SEED OIL PO) Take by mouth.    [provider]  fludrocortisone (FLORINEF) 0.1 MG tablet Take 0.2 mg by mouth 2 (two) times daily.     [provider]  ibuprofen (ADVIL,MOTRIN) 200 MG tablet Take 400 mg by mouth every 4 (four) hours as needed.    [provider]  lidocaine (LIDODERM) 5 % Place 1 patch onto the skin daily. Remove & Discard patch within 12 hours or as directed by MD    [provider]  Melatonin 10 MG CAPS Take by mouth at bedtime.     [provider]  montelukast (SINGULAIR) 10 MG  tablet Take 10 mg by mouth at bedtime.    [provider]  norethindrone-ethinyl estradiol (MICROGESTIN,JUNEL,LOESTRIN) 1-20 MG-MCG tablet Take 1 tablet by mouth daily.    [provider]  Potassium (POTASSIMIN PO) Take 10 mEq by mouth 2 (two) times daily.     [provider]  potassium chloride (K-DUR) 10 MEQ tablet TK 1 T PO BID 02/08/18   [provider]  PROAIR HFA 108 (90 Base) MCG/ACT inhaler 2-3 puffs every 4-6 hours prn  01/13/18   [provider]  SUMAtriptan (IMITREX) 25 MG tablet TAKE 1 TABLET BY MOUTH AT ONSET OF HEADACHE. MAY REPEAT IN 2 HOURS IF HEADACHE PERSISTS OR RECURS 07/01/18    Butch Penny, NP  traMADol (ULTRAM) 50 MG tablet Take 50 mg by mouth.    [provider]  [facility] 3 MG/0.5ML SOAJ INJECT 1 SYRINGE INTO THE SKIN AS NEEDED 11/09/17   Dohmeier, Porfirio Mylar, MD    Allergies    Septra [sulfamethoxazole-trimethoprim] and Sulfamethoxazole-trimethoprim  Review of Systems   Review of Systems  All other systems reviewed and are negative.   Physical Exam Updated Vital Signs BP (!) 144/76 (BP Location: Left Arm)   Pulse 97   Temp 98.1 F (36.7 C) (Oral)   Resp (!) 21   Ht 5\' 8"  (1.727 m)   Wt 77.1 kg   SpO2 99%   BMI 25.85 kg/m   Physical Exam Vitals and nursing note reviewed.  Constitutional:      General: She is not in acute distress.    Appearance: She is well-developed and well-nourished. She is not ill-appearing.  HENT:     Head: Normocephalic and atraumatic.  Eyes:     Conjunctiva/sclera: Conjunctivae normal.  Cardiovascular:     Rate and Rhythm: Normal rate and regular rhythm.  Pulmonary:     Effort: Pulmonary effort is normal. No tachypnea or respiratory distress.     Breath sounds: Examination of the right-lower field reveals rales. Rales present. No wheezing.  Abdominal:     General: Bowel sounds are normal.     Palpations: Abdomen is soft.     Tenderness: There is no abdominal tenderness.  Musculoskeletal:        General: No tenderness.     Right lower leg: No edema.     Left lower leg: No edema.  Skin:    General: Skin is warm.  Neurological:     Mental Status: She is alert.  Psychiatric:        Mood and Affect: Mood and affect normal.        Behavior: Behavior normal.     ED Results / Procedures / Treatments   Labs (all labs ordered are listed, but only abnormal results are displayed) Labs Reviewed  BASIC METABOLIC PANEL - Abnormal; Notable for the following components:      Result Value   Potassium 3.3 (*)    Glucose, Bld 136 (*)    Calcium 8.5 (*)    All other components within normal limits  CBC   PREGNANCY, URINE  TROPONIN I (HIGH SENSITIVITY)    EKG EKG Interpretation  Date/Time:  Sunday May 27 2020 13:36:20 EST Ventricular Rate:  79 PR Interval:  136 QRS Duration: 90 QT Interval:  364 QTC Calculation: 417 R Axis:   39 Text Interpretation: Sinus rhythm with Premature atrial complexes Septal infarct , age undetermined Abnormal ECG No significant change since last tracing Confirmed by [id number] [phone]) on 05/27/2020 6:35:50 PM  Radiology CT Angio Chest PE W/Cm &/Or Wo Cm  Result Date: 05/27/2020 CLINICAL DATA:  COVID-19 positive on 05/13/2020 EXAM: CT ANGIOGRAPHY CHEST WITH CONTRAST TECHNIQUE: Multidetector CT imaging of the chest was performed using the standard protocol during bolus administration of intravenous contrast. Multiplanar CT image reconstructions and MIPs were obtained to evaluate the vascular anatomy. CONTRAST:  80mL OMNIPAQUE IOHEXOL 350 MG/ML SOLN COMPARISON:  Plain film of earlier today FINDINGS: Cardiovascular: The quality of this exam for evaluation of pulmonary embolism is moderate. The bolus is centered in the SVC. No pulmonary embolism to the large segmental level. Aortic atherosclerosis. Mild cardiomegaly. Mediastinum/Nodes: No mediastinal or hilar adenopathy. Tiny hiatal hernia. Lungs/Pleura: Moderate to marked right hemidiaphragm elevation. No pleural fluid. Right base dependent/compressive atelectasis. Bilateral, peripheral predominant ground-glass and less so airspace opacities. Upper Abdomen: Normal imaged portions of the liver, spleen, pancreas, adrenal glands, left kidney 9. Musculoskeletal: No acute osseous abnormality. Review of the MIP images confirms the above findings. IMPRESSION: 1. Moderate quality exam for evaluation of pulmonary embolism. No pulmonary embolism with limitations above. 2. Bilateral peripheral predominant ground-glass and less so airspace opacities, consistent with COVID-19 pneumonia. 3. Moderate to marked right hemidiaphragm  elevation. 4. Tiny hiatal hernia. 5. Aortic Atherosclerosis (ICD10-I70.0). Electronically Signed   By: Jeronimo GreavesKyle  Talbot M.D.   On: 05/27/2020 18:18   DG Chest Portable 1 View  Result Date: 05/27/2020 CLINICAL DATA:  COVID-19.  Shortness of breath. EXAM: PORTABLE CHEST 1 VIEW COMPARISON:  Chest radiograph 07/09/2018. FINDINGS: Stable cardiac and mediastinal contours. Elevation right hemidiaphragm. Bilateral lower lung patchy airspace opacities. No pleural effusion or pneumothorax. IMPRESSION: Bibasilar opacities may represent infection in the appropriate clinical setting. Electronically Signed   By: Annia Beltrew  Davis M.D.   On: 05/27/2020 14:29    Procedures Procedures (including critical care time)  Medications Ordered in ED Medications  methylPREDNISolone sodium succinate (SOLU-MEDROL) 125 mg/2 mL injection 125 mg (125 mg Intravenous Given 05/27/20 1703)  ipratropium (ATROVENT HFA) inhaler 4 puff (4 puffs Inhalation Given 05/27/20 1634)  albuterol (VENTOLIN HFA) 108 (90 Base) MCG/ACT inhaler 8 puff (8 puffs Inhalation Given 05/27/20 1634)  AeroChamber Plus Flo-Vu Medium MISC 1 each (1 each Other Given 05/27/20 1637)  iohexol (OMNIPAQUE) 350 MG/ML injection 100 mL (80 mLs Intravenous Contrast Given 05/27/20 1741)    ED Course  I have reviewed the triage vital signs and the nursing notes.  Pertinent labs & imaging results that were available during my care of the patient were reviewed by me and considered in my medical decision making (see chart for details).    MDM Rules/Calculators/A&P                          Patient with COVID 19 illness diagnosed on 05/14/2020 (sx started 1/9) presenting with persisting symptoms and some shortness of breath. Also intermittently has chest pains with deep breathing and coughing. She endorses wheezing with hx of asthma. Feels like asthma flare. On examination, she is well-appearing, normal work of breathing. Lungs clear b/l. O2 sat is 100% on RA, maintaining excellent  O2 sat with ambulation. CXR reveals bibasilar opacities. Labs are unremarkable. Troponin obtained in triage in wnl.   She is treated with albuterol, ipratroprium, solumedrol. Some improvement w SOB but minimal, states she mostly feels her heart rate is faster from the albuterol. CTA obtained to rule out PE considering symptoms and duration of covid illness.   No evidence of PE on study, some limitation due to  contrast bolus timing, though seems most consistent with SOB related to covid virus and exacerbated by asthma. She is appropriate for discharge with prednisone burst for asthma, ipratroprium inhaler, PCP f/u and strict return precautions. Also suggested she monitor O2 saturation at home with pulse oximeter.  Patient is agreeable with plan at this time, stable for discharge  Discussed results, findings, treatment and follow up. Patient advised of return precautions. Patient verbalized understanding and agreed with plan.   Diana Boyd was evaluated in Emergency Department on 05/27/2020 for the symptoms described in the history of present illness. She was evaluated in the context of the global COVID-19 pandemic, which necessitated consideration that the patient might be at risk for infection with the SARS-CoV-2 virus that causes COVID-19. Institutional protocols and algorithms that pertain to the evaluation of patients at risk for COVID-19 are in a state of rapid change based on information released by regulatory bodies including the CDC and federal and state organizations. These policies and algorithms were followed during the patient's care in the ED.  Final Clinical Impression(s) / ED Diagnoses Final diagnoses:  Pneumonia due to COVID-19 virus  Exacerbation of asthma, unspecified asthma severity, unspecified whether persistent    Rx / DC Orders ED Discharge Orders         Ordered    predniSONE (DELTASONE) 20 MG tablet  Daily        05/27/20 1856           Mylik Pro, [place] N,  PA-C 05/27/20 2244    Charlynne Pander, MD 05/30/20 1051

## 2020-05-31 ENCOUNTER — Telehealth: Payer: Self-pay

## 2020-05-31 NOTE — Telephone Encounter (Signed)
[facility] ([phone]) called pt for HFU. LVM for return call to schedule HFU appt. 

## 2020-09-11 ENCOUNTER — Other Ambulatory Visit: Payer: Self-pay | Admitting: Family Medicine

## 2020-09-11 DIAGNOSIS — Z1231 Encounter for screening mammogram for malignant neoplasm of breast: Secondary | ICD-10-CM

## 2020-11-19 ENCOUNTER — Other Ambulatory Visit: Payer: Self-pay

## 2020-11-19 ENCOUNTER — Ambulatory Visit
Admission: RE | Admit: 2020-11-19 | Discharge: 2020-11-19 | Disposition: A | Discharge status: Home / Self Care | Payer: 59 | Source: Ambulatory Visit | Attending: Family Medicine | Admitting: Family Medicine

## 2020-11-19 DIAGNOSIS — Z1231 Encounter for screening mammogram for malignant neoplasm of breast: Secondary | ICD-10-CM

## 2020-12-26 ENCOUNTER — Ambulatory Visit
Admission: RE | Admit: 2020-12-26 | Discharge: 2020-12-26 | Disposition: A | Discharge status: Home / Self Care | Payer: 59 | Source: Ambulatory Visit | Attending: Internal Medicine | Admitting: Internal Medicine

## 2020-12-26 ENCOUNTER — Other Ambulatory Visit: Payer: Self-pay | Admitting: Internal Medicine

## 2020-12-26 DIAGNOSIS — M79674 Pain in right toe(s): Secondary | ICD-10-CM

## 2022-06-20 IMAGING — MG MM DIGITAL SCREENING BILAT W/ TOMO AND CAD
6 of 10 series · 6 of 30 positions shown · non-contrast
Comparison: Previous exam(s).

CLINICAL DATA: Screening.

EXAM:
DIGITAL SCREENING BILATERAL MAMMOGRAM WITH TOMOSYNTHESIS AND CAD
TECHNIQUE: Bilateral screening digital craniocaudal and mediolateral oblique
mammograms were obtained. Bilateral screening digital breast
tomosynthesis was performed. The images were evaluated with
computer-aided detection.

[L CC synth-2D (1 of 2)]
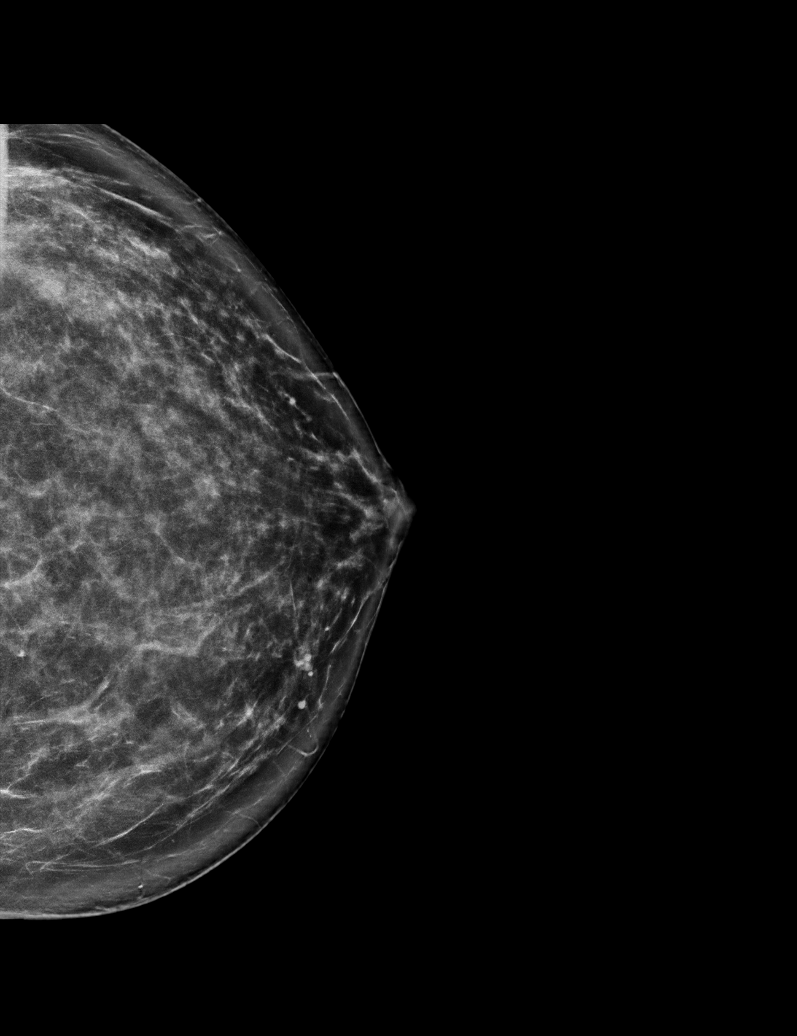

[R MLO synth-2D]
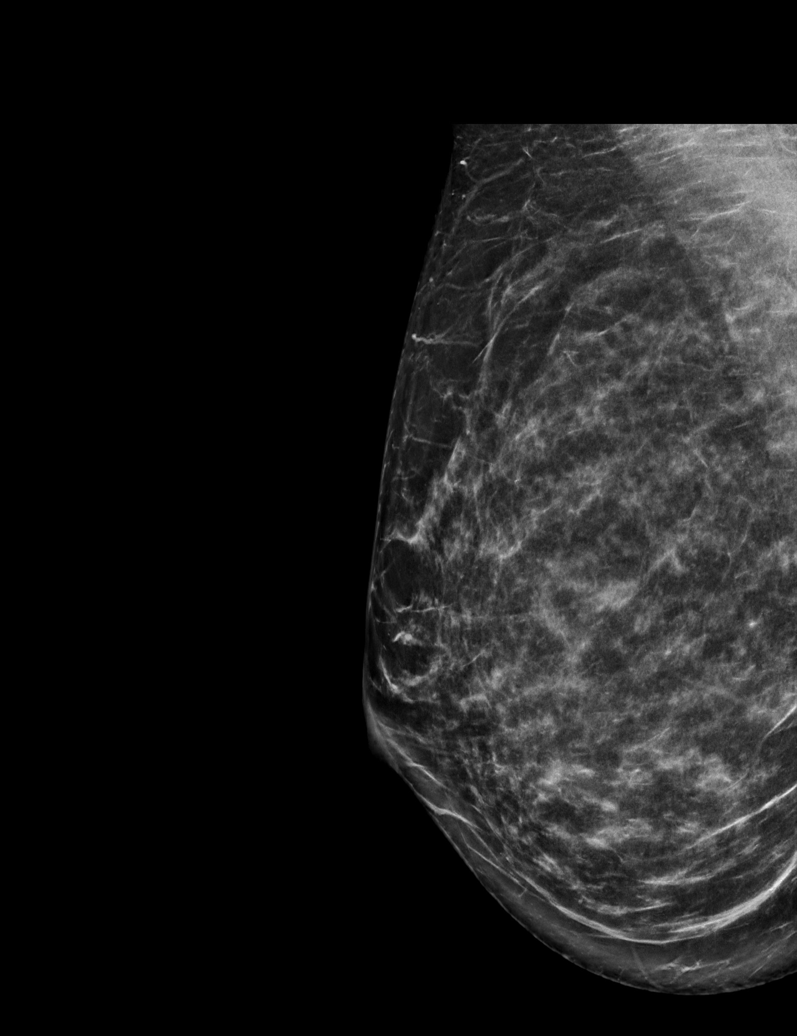

[L MLO synth-2D]
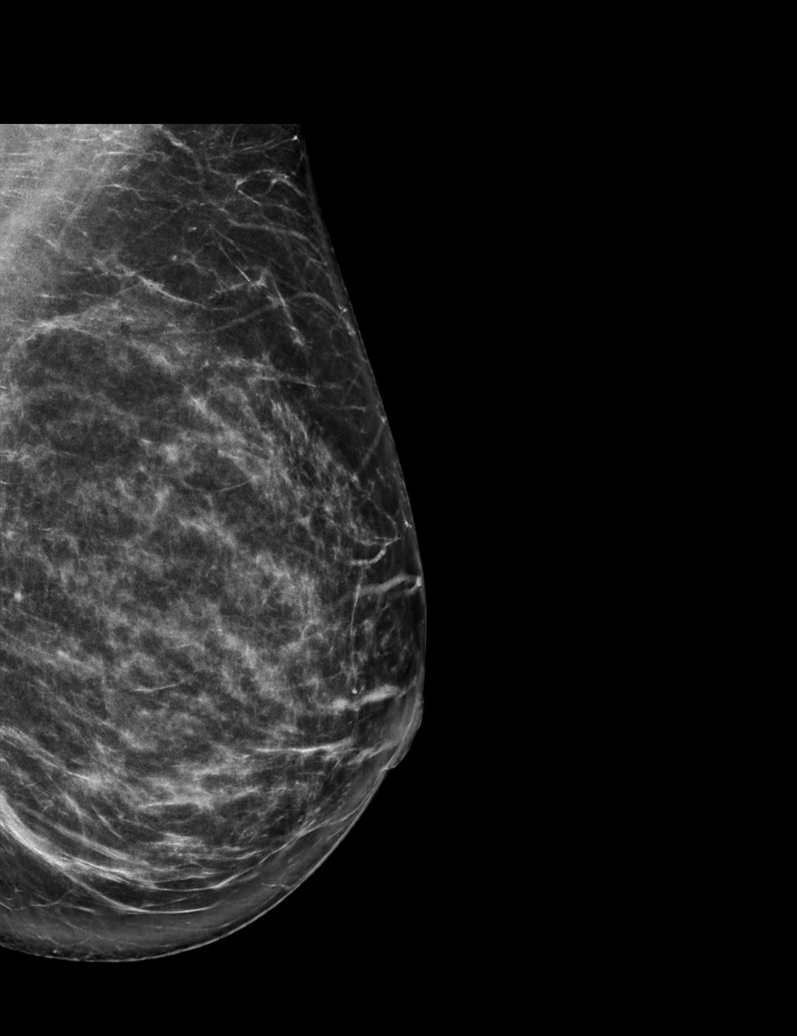

[R CC synth-2D]
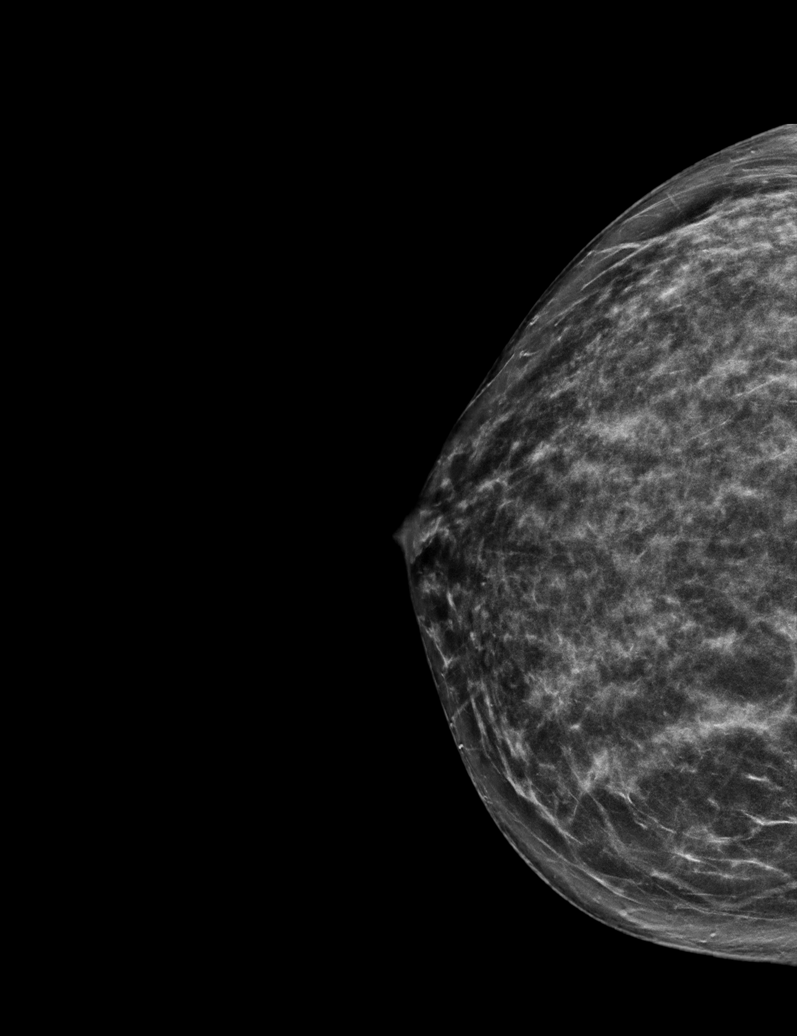

[L CC synth-2D (2 of 2)]
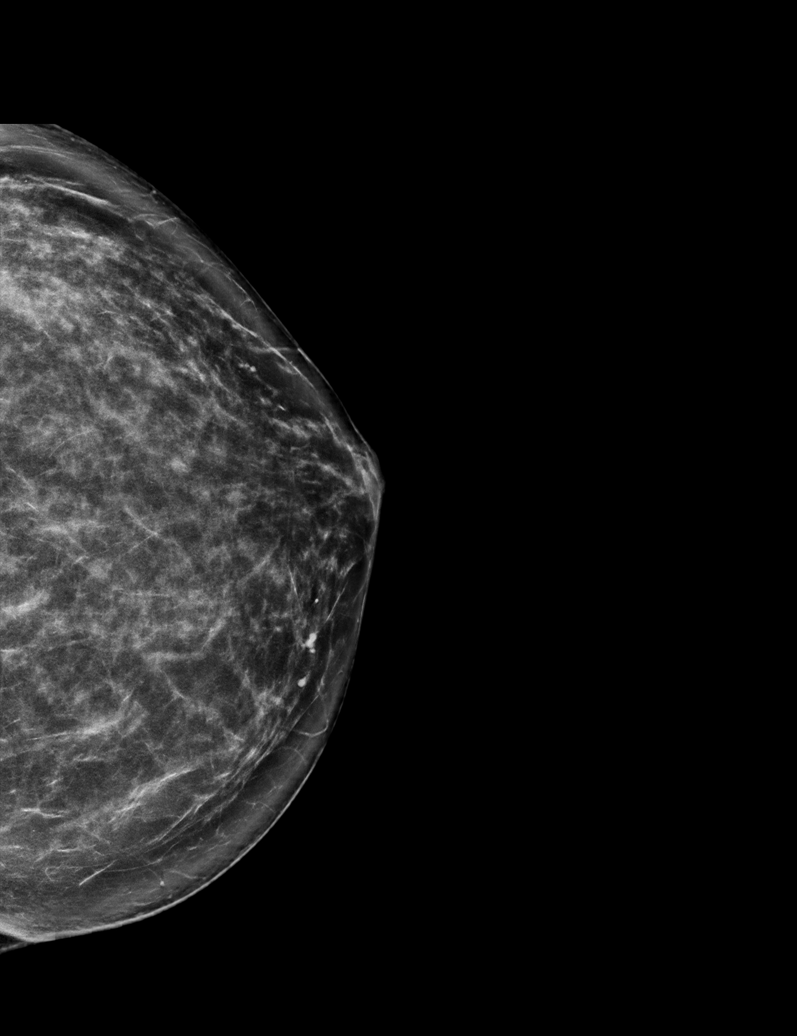

[L CC tomo · tomo slice 39/76.0]
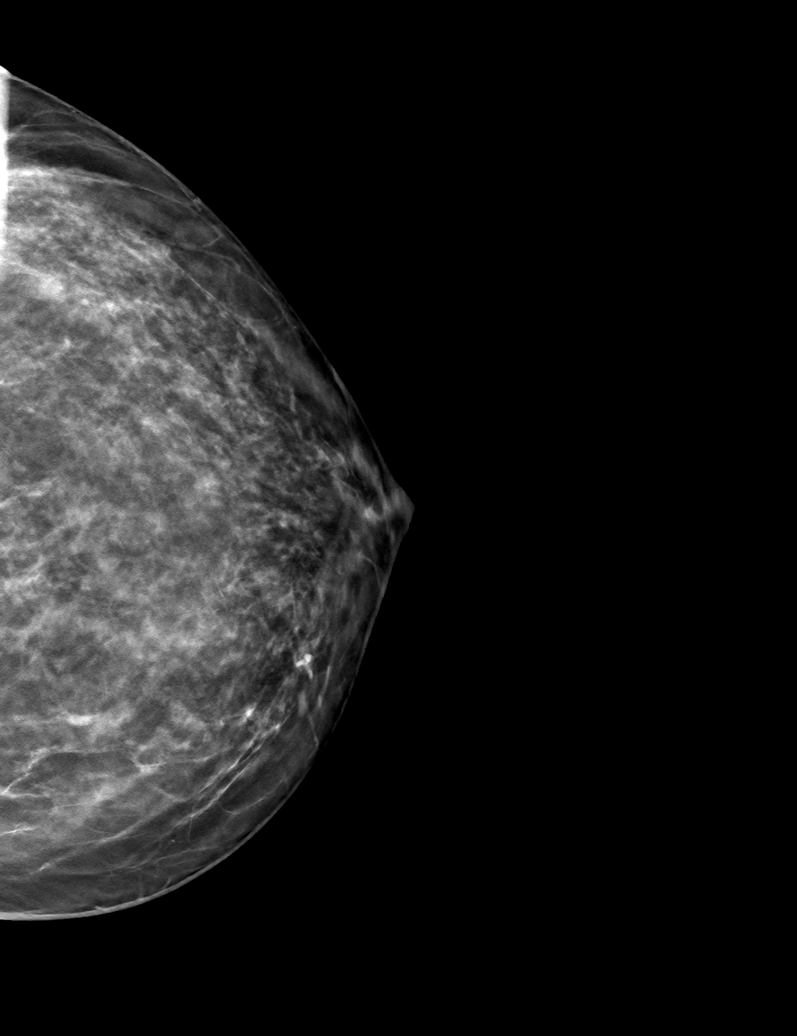

[6 of 30 positions shown; findings below may reference images not displayed]

ACR Breast Density Category c: The breast tissue is heterogeneously
dense, which may obscure small masses.
FINDINGS: There are no findings suspicious for malignancy.
IMPRESSION: No mammographic evidence of malignancy. A result letter of this
screening mammogram will be mailed directly to the patient.

RECOMMENDATION:
Screening mammogram in one year. (Code:Q3-W-BC3)

BI-RADS CATEGORY  1: Negative.

## 2022-11-24 ENCOUNTER — Other Ambulatory Visit: Payer: Self-pay | Admitting: Internal Medicine

## 2022-11-24 DIAGNOSIS — Z1231 Encounter for screening mammogram for malignant neoplasm of breast: Secondary | ICD-10-CM

## 2022-12-11 ENCOUNTER — Ambulatory Visit
Admission: RE | Admit: 2022-12-11 | Discharge: 2022-12-11 | Disposition: A | Discharge status: Home / Self Care | Payer: 59 | Source: Ambulatory Visit | Attending: Internal Medicine | Admitting: Internal Medicine

## 2022-12-11 DIAGNOSIS — Z1231 Encounter for screening mammogram for malignant neoplasm of breast: Secondary | ICD-10-CM

## 2023-01-28 ENCOUNTER — Ambulatory Visit: Payer: 59 | Admitting: Cardiology

## 2023-02-03 ENCOUNTER — Other Ambulatory Visit: Payer: Self-pay | Admitting: Internal Medicine

## 2023-02-03 DIAGNOSIS — N179 Acute kidney failure, unspecified: Secondary | ICD-10-CM

## 2023-02-04 ENCOUNTER — Ambulatory Visit
Admission: RE | Admit: 2023-02-04 | Discharge: 2023-02-04 | Disposition: A | Discharge status: Home / Self Care | Payer: No Typology Code available for payment source | Source: Ambulatory Visit | Attending: Internal Medicine | Admitting: Internal Medicine

## 2023-02-04 DIAGNOSIS — N179 Acute kidney failure, unspecified: Secondary | ICD-10-CM

## 2024-02-11 ENCOUNTER — Other Ambulatory Visit: Payer: Self-pay | Admitting: Family Medicine

## 2024-02-11 DIAGNOSIS — Z1231 Encounter for screening mammogram for malignant neoplasm of breast: Secondary | ICD-10-CM

## 2024-03-18 ENCOUNTER — Ambulatory Visit

## 2024-04-08 ENCOUNTER — Ambulatory Visit

## 2024-07-04 ENCOUNTER — Ambulatory Visit
